# Patient Record
Sex: Female | Born: 1955
Health system: Southern US, Community
[De-identification: ages and names within clinical notes are randomized; demographics above are authoritative.]

## PROBLEM LIST (undated history)

## (undated) DIAGNOSIS — H269 Unspecified cataract: Secondary | ICD-10-CM

## (undated) DIAGNOSIS — J45909 Unspecified asthma, uncomplicated: Secondary | ICD-10-CM

## (undated) DIAGNOSIS — I509 Heart failure, unspecified: Secondary | ICD-10-CM

## (undated) DIAGNOSIS — I10 Essential (primary) hypertension: Secondary | ICD-10-CM

## (undated) DIAGNOSIS — M1990 Unspecified osteoarthritis, unspecified site: Secondary | ICD-10-CM

## (undated) DIAGNOSIS — G4730 Sleep apnea, unspecified: Secondary | ICD-10-CM

## (undated) DIAGNOSIS — R7303 Prediabetes: Secondary | ICD-10-CM

## (undated) DIAGNOSIS — R635 Abnormal weight gain: Secondary | ICD-10-CM

## (undated) DIAGNOSIS — Z23 Encounter for immunization: Secondary | ICD-10-CM

## (undated) DIAGNOSIS — Z6841 Body Mass Index (BMI) 40.0 and over, adult: Secondary | ICD-10-CM

## (undated) DIAGNOSIS — K219 Gastro-esophageal reflux disease without esophagitis: Secondary | ICD-10-CM

## (undated) DIAGNOSIS — H409 Unspecified glaucoma: Secondary | ICD-10-CM

## (undated) HISTORY — DX: Morbid (severe) obesity due to excess calories: E66.01

## (undated) HISTORY — DX: Prediabetes: R73.03

## (undated) HISTORY — DX: Abnormal weight gain: R63.5

## (undated) HISTORY — DX: Sleep apnea, unspecified: G47.30

## (undated) HISTORY — DX: Gastro-esophageal reflux disease without esophagitis: K21.9

## (undated) HISTORY — DX: Essential (primary) hypertension: I10

## (undated) HISTORY — DX: Encounter for immunization: Z23

## (undated) HISTORY — DX: Heart failure, unspecified: I50.9

## (undated) HISTORY — DX: Body Mass Index (BMI) 40.0 and over, adult: Z684

## (undated) HISTORY — DX: Unspecified glaucoma: H40.9

## (undated) HISTORY — DX: Unspecified asthma, uncomplicated: J45.909

## (undated) HISTORY — DX: Unspecified cataract: H26.9

## (undated) HISTORY — DX: Unspecified osteoarthritis, unspecified site: M19.90

---

## 2010-07-24 DIAGNOSIS — K635 Polyp of colon: Secondary | ICD-10-CM

## 2010-07-24 HISTORY — PX: KNEE SURGERY: SHX244

## 2010-07-24 HISTORY — DX: Polyp of colon: K63.5

## 2014-07-24 DIAGNOSIS — I2699 Other pulmonary embolism without acute cor pulmonale: Secondary | ICD-10-CM

## 2014-07-24 HISTORY — DX: Other pulmonary embolism without acute cor pulmonale: I26.99

## 2017-06-08 IMAGING — CT CT Brain W-O Contrast
2 of 4 series · 11 of 40 positions shown, 13 images · non-contrast
Comparison: none

CT Brain W-O Contrast
INDICATION: Neurologic Changes.  Low blood sugar                                         
 Pertinent History:                                                                        
 Surgical History:                                                                         
 Cancer: None                                                                              
 Comments: None.
TECHNIQUE: Helical acquisition with coronal reformats.                                    
 Utilized dose reduction techniques include: Vendor specific iterative                     
 reconstruction technique                                                                  
 Comparison is made with the old study from 07/06/2016.                                    
 The noncontrast CT images through the brain demonstrate no midline shift, mass            
 effect or acute intracranial hemorrhage. Only slight cerebral atrophy. No acute           
 cortical infarction or mass is identified. Included paranasal sinuses and                 
 mastoid air cells are adequately aerated. Cerebellar tonsils remain borderline            
 low-lying, similar to previous.

[Series 5: coronal · coronal · 0.49mm/px · 3 of 69 slices shown]
[im 23/69  brain]
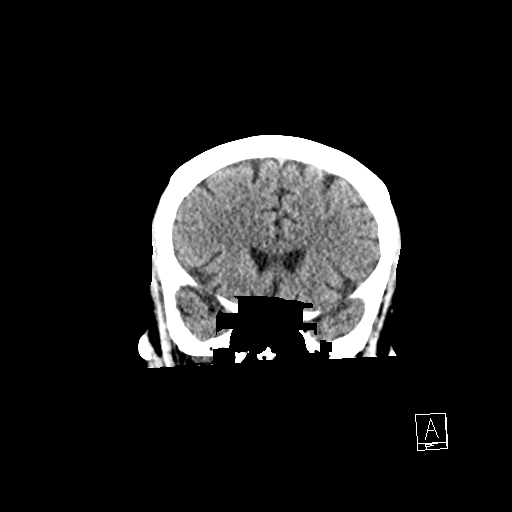
[im 31/69  brain]
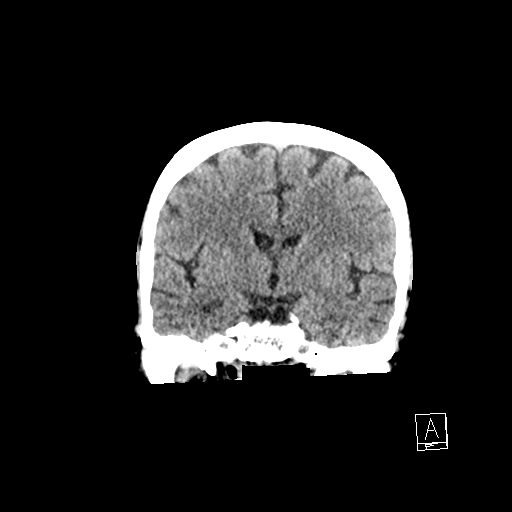
[im 38/69  brain]
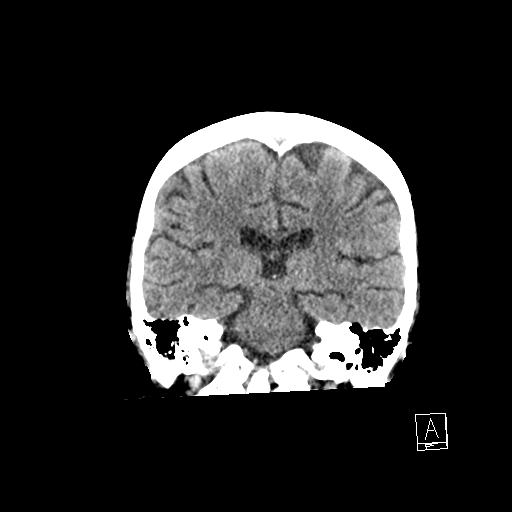

[Series 6: axial · axial · 0.49mm/px · z∈[+976,+1089]mm · 8 of 50 slices shown, 10 images]
[im 6/50  brain]
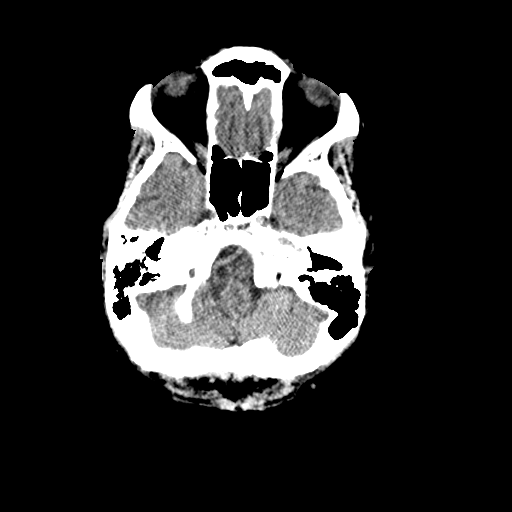
[im 6/50  bone]
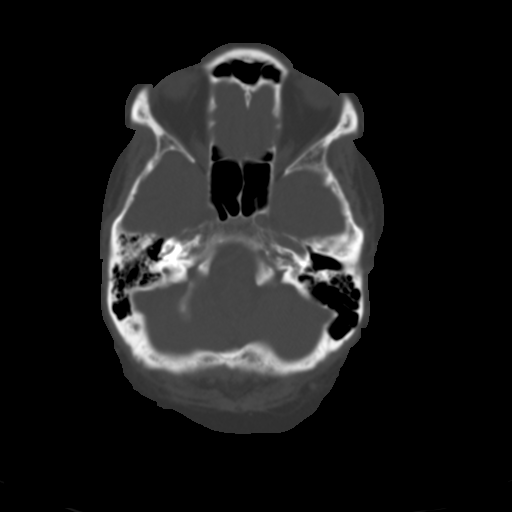
[im 11/50  brain]
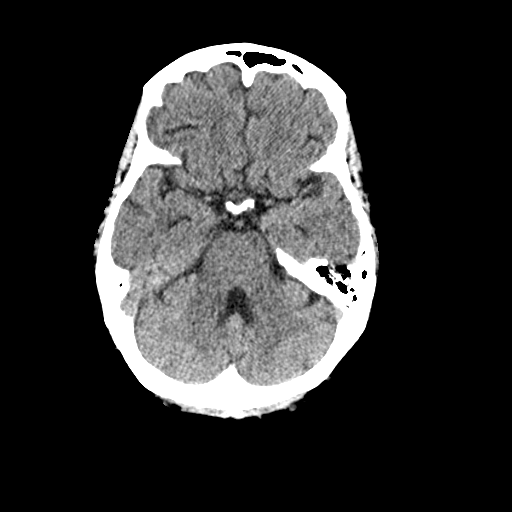
[im 17/50  brain]
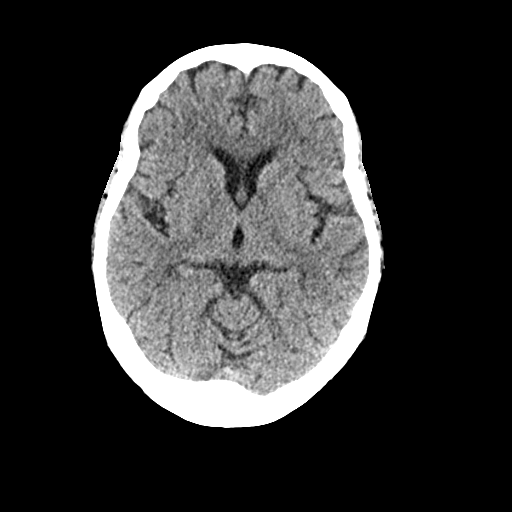
[im 22/50  brain]
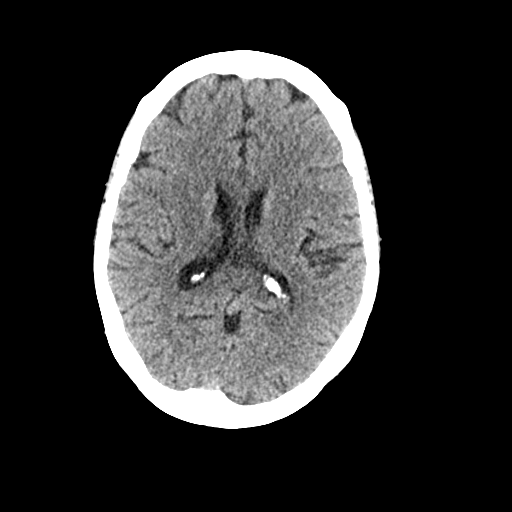
[im 28/50  brain]
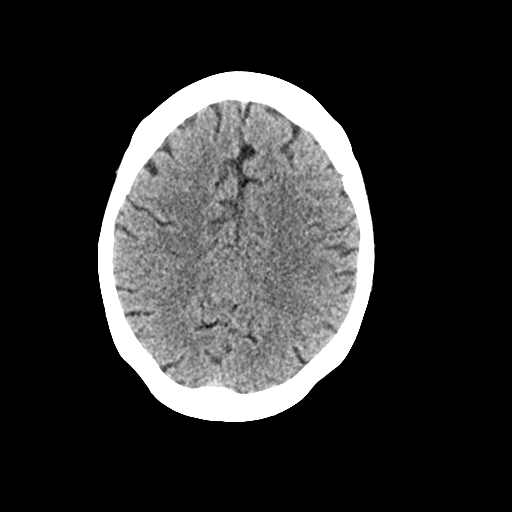
[im 28/50  bone]
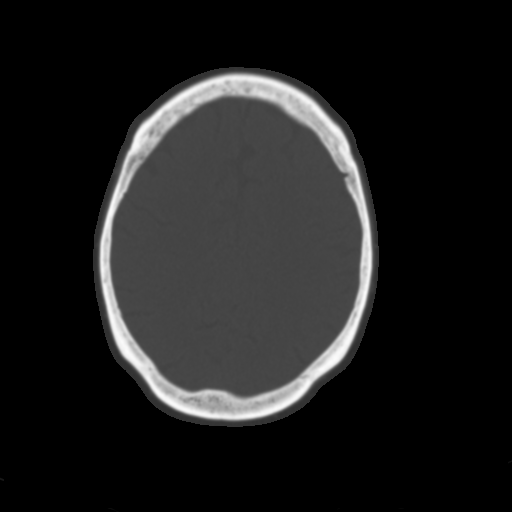
[im 33/50  brain]
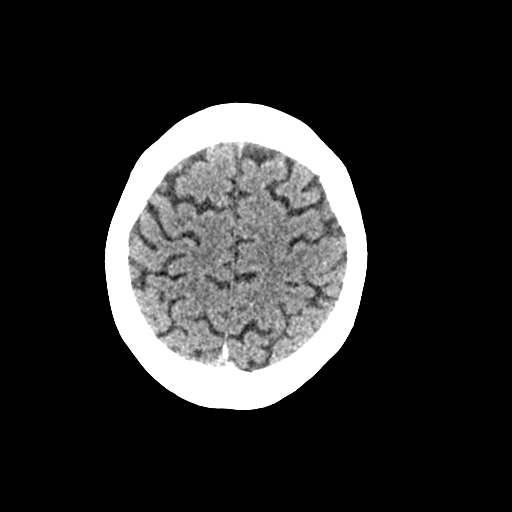
[im 39/50  brain]
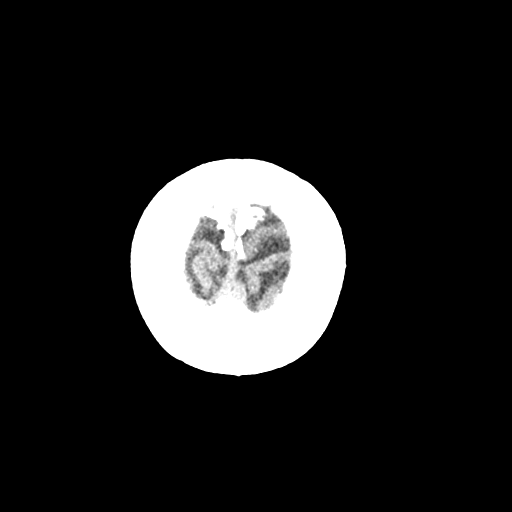
[im 44/50  brain]
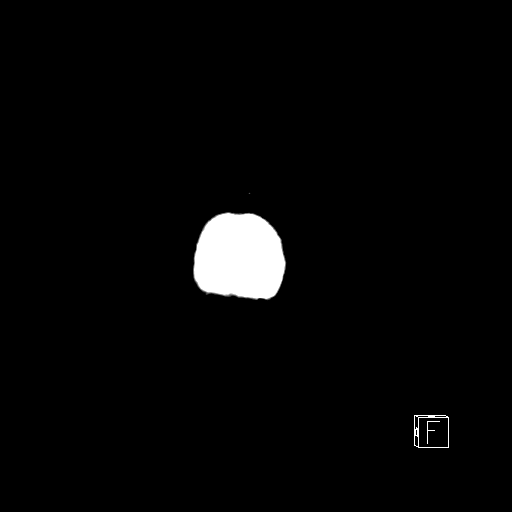

[11 of 40 positions shown; findings below may reference images not displayed]

IMPRESSION: No acute intracranial abnormality.

## 2019-02-13 DIAGNOSIS — I10 Essential (primary) hypertension: Secondary | ICD-10-CM | POA: Insufficient documentation

## 2019-02-13 DIAGNOSIS — I509 Heart failure, unspecified: Secondary | ICD-10-CM | POA: Insufficient documentation

## 2019-02-13 DIAGNOSIS — M069 Rheumatoid arthritis, unspecified: Secondary | ICD-10-CM | POA: Insufficient documentation

## 2019-02-13 DIAGNOSIS — E119 Type 2 diabetes mellitus without complications: Secondary | ICD-10-CM | POA: Insufficient documentation

## 2019-02-13 DIAGNOSIS — M1990 Unspecified osteoarthritis, unspecified site: Secondary | ICD-10-CM | POA: Insufficient documentation

## 2019-02-19 DIAGNOSIS — Z86711 Personal history of pulmonary embolism: Secondary | ICD-10-CM | POA: Insufficient documentation

## 2019-02-19 DIAGNOSIS — E119 Type 2 diabetes mellitus without complications: Secondary | ICD-10-CM | POA: Insufficient documentation

## 2019-02-19 DIAGNOSIS — R600 Localized edema: Secondary | ICD-10-CM | POA: Insufficient documentation

## 2019-02-19 DIAGNOSIS — G4733 Obstructive sleep apnea (adult) (pediatric): Secondary | ICD-10-CM | POA: Insufficient documentation

## 2019-02-19 DIAGNOSIS — R0683 Snoring: Secondary | ICD-10-CM | POA: Insufficient documentation

## 2019-02-19 DIAGNOSIS — M1710 Unilateral primary osteoarthritis, unspecified knee: Secondary | ICD-10-CM | POA: Insufficient documentation

## 2019-02-20 DIAGNOSIS — Z713 Dietary counseling and surveillance: Secondary | ICD-10-CM | POA: Insufficient documentation

## 2019-03-19 DIAGNOSIS — Z8669 Personal history of other diseases of the nervous system and sense organs: Secondary | ICD-10-CM

## 2019-03-19 HISTORY — DX: Personal history of other diseases of the nervous system and sense organs: Z86.69

## 2019-04-01 ENCOUNTER — Other Ambulatory Visit: Payer: Self-pay | Admitting: Physician Assistant

## 2019-08-29 ENCOUNTER — Encounter (INDEPENDENT_AMBULATORY_CARE_PROVIDER_SITE_OTHER): Payer: Self-pay | Admitting: Family Medicine

## 2019-09-04 ENCOUNTER — Encounter (INDEPENDENT_AMBULATORY_CARE_PROVIDER_SITE_OTHER): Payer: Self-pay | Admitting: Family Medicine

## 2019-09-18 ENCOUNTER — Encounter (INDEPENDENT_AMBULATORY_CARE_PROVIDER_SITE_OTHER): Payer: Self-pay | Admitting: Family Medicine

## 2019-09-21 ENCOUNTER — Encounter (INDEPENDENT_AMBULATORY_CARE_PROVIDER_SITE_OTHER): Payer: Self-pay

## 2019-09-22 ENCOUNTER — Telehealth (INDEPENDENT_AMBULATORY_CARE_PROVIDER_SITE_OTHER): Payer: No Typology Code available for payment source | Admitting: Family Medicine

## 2019-09-22 ENCOUNTER — Encounter (HOSPITAL_BASED_OUTPATIENT_CLINIC_OR_DEPARTMENT_OTHER): Payer: Self-pay

## 2019-09-22 ENCOUNTER — Encounter (HOSPITAL_BASED_OUTPATIENT_CLINIC_OR_DEPARTMENT_OTHER): Payer: Self-pay | Admitting: Family Medicine

## 2019-09-22 DIAGNOSIS — K219 Gastro-esophageal reflux disease without esophagitis: Secondary | ICD-10-CM

## 2019-09-22 DIAGNOSIS — R7303 Prediabetes: Secondary | ICD-10-CM

## 2019-09-22 DIAGNOSIS — I10 Essential (primary) hypertension: Secondary | ICD-10-CM

## 2019-09-22 DIAGNOSIS — I509 Heart failure, unspecified: Secondary | ICD-10-CM

## 2019-09-22 NOTE — Progress Notes (Signed)
Verbal consent has been obtained from the patient to conduct a video & telephone visit to minimize exposure to COVID-19 - YES    PCP: Caitlin Jacks, MD    ASSESSMENT AND PLAN:  Caitlin Malone was seen today for initial consult.    Diagnoses and all orders for this visit:    Morbid obesity    Gastroesophageal reflux disease, unspecified whether esophagitis present    Hypertension, unspecified type    Prediabetes    Congestive heart failure, unspecified HF chronicity, unspecified heart failure type      Chronic Illness Exacerbated 2/2 Wt gain.    Causes for weight gain and barriers to success noted in the HPI section discussed including food intake/habits and activity,  sleep patterns, med and labs review . Assessed weight related medical conditions noted above and add'l labs ordered if applicable.     PLAN:  Resolving Obesity/Overweight Recommendations:  Body mass index is 39.54 kg/m. Weight: 245 lb  Weight in (lb) to have BMI = 25: 154.6  1. She is interested in joining the [facility] and has began the enrollment process.  I agree, as this would be the best of the medical weight loss options to assist with weight loss.   The primary recommendation however is Weight Loss surgery, given her co morbidities and current BMI   Cautions/Concerns: on lasix 2/2 CHF, would need to be d/c'd when she starts the [facility], monitor kidney fxn.  Would need cardiac clearance as well prior to starting [facility].       CHF- stable . Follows with cardiology.     GERD, predm - weight related chronic medical condition(s) : Progressive with weight gain.   Should  improve with adequate weight loss (>5% BW) and implementation of current mgmt plan. Monitor.    Follow up with Caitlin Malone, Bariatric NP in 2 months.     Recommended weight loss options discussed:       [facility] ([facility]) for those with a BMI of >25. A comprehensive program that includes a Web designer, dieticians, exercise physiologists, and a behavioral  therapist.  The program entails placement on a very low calorie diet (VLCD)   (<800 Cal/day)  or low calorie diet (LCD) (<1000, <1200 Cal/day) along with a structured and graded exercise regimen and several informational sessions.   Typical weight loss is about 1-10 pounds/ week. AE includes fatigue, dizziness, headache or nausea, constipation, halitosis and muscle cramps in the first few days of the diet.   Medical clearance required by PCP, along with CBC w/o diff, CMP, TSH,  Lipid panel , Vitamin D,  Hgba1c (if applicable)  and EKG.       Weight loss medications/ Anti Obesity Medications (WLM/ AOM) such as Orlistat, Phentermine, Qsymia, Contrave, Liraglutide (Victoza/Saxenda), or Metformin were discussed. The indications, risks and benefits, and efficacy of each  were discussed in detail. Great caution must be taken as these weight loss medications are high risk/ controlled substances. Questions posed discussed.  The typical weight loss ranges from 5%-15% EBW with the use of the medications.        VLCD or LCD , along with an structured physical activity regimen ( exercise prescription)  with frequent office visits with our Bariatric Medicine team 1 on 1 .       Weight Loss Surgery (WLS)-   Gastric Band, Gastric sleeve,  gastric bypass   (BMI >35 with co morbidities, or BMI >40), intragastric balloon  (BMI 30-40), or revision (if  applicable).     Total time spent: 60-74  minutes was spent preparing for this visit (reviewing pt's chart, history, tests ),  interpreting results and communicating with pt, documentation in med record,  answering pt's questions, counseling & discussing behavioral modification for successful weight loss,  and coordination of care with respect to the above mentioned diagnoses and treatment plan and recommendations of the best weight loss option for the patient.            Subjective  Chief Complaint   Patient presents with   . Initial Consult     non sx wl    - Seen for  Initial Medical  Weight Loss Evaluation.   I had the pleasure of seeing Caitlin Malone,  Body mass index is 39.54 kg/m. Weight: 245 lb who has been struggling with weight management for a few years.      Weight gain hx    Personal goal weight(lb):  220 lb    (last: 15 yrs ago)  Has been losing 1 lb /week in past 7 weeks   Highest adult weight (lb): 246 (with [facility])   Lowest adult weight (lb):  (Weight in (lb) to have BMI = 25: 154.6)  RX tried: none       Associated factors of weight gain:    PA:  Walks 3x week, lifting weights as well.    Excess carbs/ sweets: yes  Emotional/comfort/ boredom eating: yes  Grazing/snacking: yes  Skip meals:  Night time eating:   Denies all except noted    ROS:    Negative for cp, palp.   Pos for ankle swelling (stable now on lasix 2/2 CHF)   As stated in HPI, all other systems reviewed and are otherwise negative.       Chf:  Stable on rx (lasix) , 4 weeks ago seen by cards. Stable (saukla)  ekg 1x 3 mos, stable ,   htn - stable   gerd - stable  Asthma - stable,, only uses inhaler prn   On neuontin--- started in dec for arthritis.     Past Surgical History:   Procedure Laterality Date   . KNEE SURGERY Right 2012     There is no problem list on file for this patient.    Past Medical History:   Diagnosis Date   . Asthma    . Congestive heart failure    . Gastroesophageal reflux disease    . Hypertension    . Prediabetes      Past Surgical History:   Procedure Laterality Date   . KNEE SURGERY Right 2012       Current Outpatient Medications:   .  Eliquis 5 MG, Take 5 mg by mouth daily, Disp: , Rfl:   .  furosemide (LASIX) 20 MG tablet, Take 20 mg by mouth daily, Disp: , Rfl:   .  gabapentin (NEURONTIN) 300 MG capsule, TAKE 1 CAPSULE BY MOUTH EVERY MORNING AND 2 CAPSULES BY MOUTH AT BEDTIME, Disp: , Rfl:   .  hydroxychloroquine (PLAQUENIL) 200 MG tablet, Take 200 mg by mouth 2 (two) times daily, Disp: , Rfl:   .  Loratadine (CLARITIN PO), Take by mouth daily, Disp: , Rfl:   .   olmesartan-hydrochlorothiazide (BENICAR HCT) 40-25 MG per tablet, Take 1 tablet by mouth daily, Disp: , Rfl:   .  potassium chloride 20 MEQ Tab CR, TK 1 T PO ONCE D, Disp: , Rfl:   No Known Allergies  Social History     Socioeconomic History   . Marital status: Married     Spouse name: Not on file   . Number of children: Not on file   . Years of education: Not on file   . Highest education level: Not on file   Occupational History   . Not on file   Social Needs   . Financial resource strain: Not on file   . Food insecurity     Worry: Not on file     Inability: Not on file   . Transportation needs     Medical: Not on file     Non-medical: Not on file   Tobacco Use   . Smoking status: Never Smoker   . Smokeless tobacco: Never Used   Substance and Sexual Activity   . Alcohol use: Not Currently   . Drug use: Never   . Sexual activity: Not on file   Lifestyle   . Physical activity     Days per week: Not on file     Minutes per session: Not on file   . Stress: Not on file   Relationships   . Social Wellsite geologist on phone: Not on file     Gets together: Not on file     Attends religious service: Not on file     Active member of club or organization: Not on file     Attends meetings of clubs or organizations: Not on file     Relationship status: Not on file   . Intimate partner violence     Fear of current or ex partner: Not on file     Emotionally abused: Not on file     Physically abused: Not on file     Forced sexual activity: Not on file   Other Topics Concern   . Dietary supplements / vitamins No   . Anesthesia problems No   . Blood thinners No   . Pregnant Not Asked   . Future Children Not Asked   . Number of Pregnancies? Not Asked   . Number of children Not Asked   . Miscarriages / Abortions? Not Asked   . Eats large amounts No   . Excessive Sweets No   . Skips meals No   . Eats excessive starches No   . Snacks or grazes Yes   . Emotional eater Yes   . Eats fried food No   . Eats fast food No   . Diet Center  No   . Doylene Bode No   . [place] [facility] No   . Nutri-System No   . Opti-Fast / Medi-Fast No   . Overeaters Anonymous No   . [facility] Yes   . TOPS No   . Weight Watchers No   . Atkins No   . Binging / Purging No   . Calorie Counting No   . Fasting No   . High Protein Yes   . Low Carb Yes   . Low Fat Yes   . [facility] Diet No   . Slim Fast No   . [place] No   . Stationary cycle or treadmill No   . Gym/fitness Classes No   . Home exercise/video No   . Swimming No   . Weight training No   . Walking or running Yes   . Hospitalization No   . Hypnosis No   . Physical therapy No   .  Psychological therapy No   . Residential program No   . Acutrim No   . [place]etta No   . Contrave No   . Dexatrim No   . Diethylpropion No   . Fastin No   . Fen - Phen No   . Ionamin / Adipex No   . Phentermine No   . Qsymia No   . Prozac No   . Saxenda No   . Topamax No   . Wellbutrin No   . Xenical (Orlistat, Alli) No   . Other Med No   . No impairment No   . Walks with cane/crutch No   . Requires a wheelchair No   . Bedridden No   . Are you currently being treated for depression? No   . Do you snore? No   . Are you receiving any medical or psychological services? No   . Do you ever wake up at night gasping for breath? No   . Do you have or have you been treated for an eating disorder? No   . Anyone ever told you that you stop breathing while asleep? No   . Do you exercise regularly? Yes   . Have you or family member ever have trouble with anesthesia? No   Social History Narrative   . Not on file       History reviewed. No pertinent family history.  Patient Care Team:  Caitlin Jacks, MD as PCP - General (Cardiology)    Family, Social history , Problem list and Allergies and meds reviewed.    Objective   Physical Exam     Constitutional: Body mass index is 39.54 kg/m., Well-developed, well-nourished. No distress.    Pulmonary/Chest: Effort normal  Neuro:  A&O x 3.   Psych:  Appropiate mood & affect. Behavior,  judgment & thought content normal.     Labs, studies reviewed:  '  Chemistry    No results found for: NA, K, CL, CO2, BUN, CREAT, GLU No results found for: CA, ALKPHOS, AST, ALT, BILITOTAL       No results found for: ALT, AST, GGT, ALKPHOS, BILITOTAL  No results found for: CHOL  No results found for: HDL  No results found for: LDL  No results found for: TRIG  No results found for: HGBA1C  No results found for: GLU, MICROALB, LDL, CREAT

## 2019-09-23 ENCOUNTER — Encounter (INDEPENDENT_AMBULATORY_CARE_PROVIDER_SITE_OTHER): Payer: Self-pay | Admitting: Family Medicine

## 2019-09-30 ENCOUNTER — Encounter (INDEPENDENT_AMBULATORY_CARE_PROVIDER_SITE_OTHER): Payer: Self-pay

## 2019-10-10 ENCOUNTER — Other Ambulatory Visit (INDEPENDENT_AMBULATORY_CARE_PROVIDER_SITE_OTHER): Payer: Self-pay | Admitting: Family Medicine

## 2019-10-13 ENCOUNTER — Ambulatory Visit: Payer: No Typology Code available for payment source

## 2019-10-13 NOTE — Progress Notes (Signed)
Initial Exercise Eval     Start Results: Date: 10/13/19           Have you had a formal exercise program? Yes    Where do you want to exercise?: Home   Have you ever used weights? Yes     Have you ever done exercise classes? Yes  How many days do you exercise a week? +4 days per week                    Do you have home equipment? Yes List Equipment: Free Weights          Personal Goals       1. Strengthen Legs                          2. Tone                              3.Lose Weight

## 2019-10-14 ENCOUNTER — Encounter (INDEPENDENT_AMBULATORY_CARE_PROVIDER_SITE_OTHER): Payer: Self-pay | Admitting: Family Medicine

## 2019-10-14 ENCOUNTER — Ambulatory Visit (INDEPENDENT_AMBULATORY_CARE_PROVIDER_SITE_OTHER): Payer: No Typology Code available for payment source | Admitting: Family Medicine

## 2019-10-14 VITALS — BP 130/86 | HR 68 | Temp 98.3°F | Resp 17 | Ht 66.0 in | Wt 245.2 lb

## 2019-10-14 DIAGNOSIS — M25561 Pain in right knee: Secondary | ICD-10-CM

## 2019-10-14 DIAGNOSIS — Z8639 Personal history of other endocrine, nutritional and metabolic disease: Secondary | ICD-10-CM

## 2019-10-14 DIAGNOSIS — R2689 Other abnormalities of gait and mobility: Secondary | ICD-10-CM

## 2019-10-14 DIAGNOSIS — I10 Essential (primary) hypertension: Secondary | ICD-10-CM

## 2019-10-14 DIAGNOSIS — M1990 Unspecified osteoarthritis, unspecified site: Secondary | ICD-10-CM

## 2019-10-14 DIAGNOSIS — G8929 Other chronic pain: Secondary | ICD-10-CM

## 2019-10-14 DIAGNOSIS — I509 Heart failure, unspecified: Secondary | ICD-10-CM

## 2019-10-14 DIAGNOSIS — Z86711 Personal history of pulmonary embolism: Secondary | ICD-10-CM

## 2019-10-14 DIAGNOSIS — W19XXXA Unspecified fall, initial encounter: Secondary | ICD-10-CM

## 2019-10-14 DIAGNOSIS — M25562 Pain in left knee: Secondary | ICD-10-CM

## 2019-10-14 DIAGNOSIS — G5622 Lesion of ulnar nerve, left upper limb: Secondary | ICD-10-CM

## 2019-10-14 NOTE — Progress Notes (Signed)
[facility]                       Date of Exam: 10/14/2019 4:06 PM        Patient ID: Caitlin Malone is a 64 y.o. female.  Attending Physician: Tyrell Antonio, FNP           This note was dictated using Dragon Medical One software, by [employer], a voice recognition speech to text software, and may contain inadvertent recognition errors. There may be inherent errors or limitations not intended by the user. Not all errors are caught or corrected. If there are questions or concerns about the content of this note or the information contained within the body of this dictation they should be addressed directly with the author for clarification.    Subjective:   Chief Complaint:  Chief Complaint   Patient presents with   . Establish Care     Pt is not fasting   . Knee Pain     Pt c/o bilateral knees weakness x 3-6 mo.  Arthritis dx 7 years.   . Fall     Pt fell yesterday. Pt did not hit head. Fell onto R knee.    Marland Kitchen Hypertension     Pt concerns of low at home bp readings.       HPI     Patient is a 64 year old female who presents today to establish care and with complaints of bilateral knee pain 3-6 moths, and pt had a fall yesterday onto the right knee.Pt moved from [facility] July 29.  She has a lot of specialYdoctors but does not have a primary care provider in the area.  She did not bring any medical records with her    1. Chronic pain of both knees   bilateral knee pain 3-6 moths, and pt had a fall yesterday onto the right knee.  Pt states she had been getting osteoarthritis injection.  She was going to a pain clinic.  Patient requests referral to pain clinic  The last injection was in November.    2. Fall, initial encounter  The patient fell yesterday and hit her right knee on the curb.  She did not hit her head.  She did not lose consciousness.  She states she has some pain and tenderness in her knee but denies any instability or immobility.    4. History of  pulmonary embolus (PE)  Hx of pe- pt aug 2016- sees pulmonologist them three times a year   Patient is currently on Eliquis 5 mg daily  Patient denies any issues with medication.    5. History of vitamin D deficiency  Patient has a history of vitamin D deficiency.  She currently takes vitamin D.    6.Entrapment of left ulnar nerve  Patient states she does not have full use of her left hand.  She cannot make a fist or close her fingers all the way.  She has seen a neurologist and neurosurgeon.  She has had EMG studies and has been diagnosed with ulnar nerve entrapment.    8. Hypertension, unspecified type  Patient currently takes on l olmesartan- hydrochlorothiazide 40/25 Mg a day  Pt takes medication regularly.  Patient states she does take her blood pressure at home.  Patient states sometimes her blood pressure is very low like 88/54.  Other times it is high like 130s to 140s  Pt denies ankle edema, headache, chest  pain, or palpitations.     9. Morbid obesity  Patient has been seen by weight management and dietary specialist.  She is going to be going to a new dietary specialist next week.  Pt has lost around 30 pounds over the past couple months.  She states she was 276LBS and on her home scale is 239LBS.     10. Arthritis  Patient currently takes gabapentin for pain.  Pt states that at times her whole body is in pain  Pt started 6 years ago.   She states her symptoms are aggrivated by temperature and bariartric pressure    11. Balance disturbance:  Patient states her balance has been feeling off.  She has seen neurology and neurosurgery.  They are unable to determine the cause.  Patient was found to have History of Chiari malformation.  This was detected on MRI after a fall she had in August.   She states she just feels unsteady.  She denies any dizziness or troubles with her vision.     Pt started started taking the iron.   Pt is taking probiotic   Patient requests referral for GYN.  Had a mammogram last year.   Has not had a Pap for a few years.  Patient will make follow-up visit.    Patient request refill  Of potassium chloride.-Patient to get updated blood work      Problem List:  Patient Active Problem List   Diagnosis   . Type 2 diabetes mellitus without complication   . Obstructive sleep apnea syndrome   . Morbid obesity   . Hypertensive disorder   . Encounter for weight loss counseling   . Edema of lower extremity   . Diabetes mellitus   . Congestive heart failure   . Arthritis of knee   . Arthritis   . Snoring   . History of pulmonary embolism       Current Medications:  Current Outpatient Medications   Medication Sig Dispense Refill   . Eliquis 5 MG Take 5 mg by mouth daily     . Ferrous Sulfate (IRON PO) Take by mouth     . furosemide (LASIX) 20 MG tablet Take 20 mg by mouth daily     . gabapentin (NEURONTIN) 300 MG capsule TAKE 1 CAPSULE BY MOUTH EVERY MORNING AND 2 CAPSULES BY MOUTH AT BEDTIME     . Loratadine (CLARITIN PO) Take by mouth daily     . montelukast (SINGULAIR) 10 MG tablet montelukast 10 mg tablet   TK 1 T PO  HS     . olmesartan-hydrochlorothiazide (BENICAR HCT) 40-25 MG per tablet Take 1 tablet by mouth daily     . potassium chloride 20 MEQ Tab CR TK 1 T PO ONCE D     . VITAMIN D PO Take by mouth     . hydroxychloroquine (PLAQUENIL) 200 MG tablet Take 200 mg by mouth 2 (two) times daily       No current facility-administered medications for this visit.       Allergies:  No Known Allergies    Immunizations:  Immunization History   Administered Date(s) Administered   . COVID-19 mRNA Vaccine Preservative Free 0.3 mL (PFIZER) 10/11/2019   . Influenza quadrivalent (IM) 6 months & up PRESERVED 04/23/2018        Past Medical History:  Past Medical History:   Diagnosis Date   . Asthma    . Congestive heart failure    . Gastroesophageal reflux  disease    . History of Chiari malformation 03/19/2019    [facility] ER, dx after fall   . Hypertension    . Prediabetes        Past Surgical History:  Past  Surgical History:   Procedure Laterality Date   . KNEE SURGERY Right 2012       Family History:  Family History   Problem Relation Age of Onset   . Cancer Mother    . Diabetes Mother    . Arthritis Mother    . Obesity Mother    . Cancer Maternal Grandfather        Social History:  Social History     Socioeconomic History   . Marital status: Married     Spouse name: Not on file   . Number of children: Not on file   . Years of education: Not on file   . Highest education level: Not on file   Occupational History   . Not on file   Tobacco Use   . Smoking status: Never Smoker   . Smokeless tobacco: Never Used   Substance and Sexual Activity   . Alcohol use: Not Currently   . Drug use: Never   . Sexual activity: Not on file   Other Topics Concern   . Dietary supplements / vitamins No   . Anesthesia problems No   . Blood thinners No   . Pregnant Not Asked   . Future Children Not Asked   . Number of Pregnancies? Not Asked   . Number of children Not Asked   . Miscarriages / Abortions? Not Asked   . Eats large amounts No   . Excessive Sweets No   . Skips meals No   . Eats excessive starches No   . Snacks or grazes Yes   . Emotional eater Yes   . Eats fried food No   . Eats fast food No   . Diet Center No   . Doylene Bode No   . [place] Weight Loss No   . Nutri-System No   . Opti-Fast / Medi-Fast No   . Overeaters Anonymous No   . [facility] Yes   . TOPS No   . Weight Watchers No   . Atkins No   . Binging / Purging No   . Calorie Counting No   . Fasting No   . High Protein Yes   . Low Carb Yes   . Low Fat Yes   . [facility] Diet No   . Slim Fast No   . [place] No   . Stationary cycle or treadmill No   . Gym/fitness Classes No   . Home exercise/video No   . Swimming No   . Weight training No   . Walking or running Yes   . Hospitalization No   . Hypnosis No   . Physical therapy No   . Psychological therapy No   . Residential program No   . Acutrim No   . [place]etta No   . Contrave No   . Dexatrim No   .  Diethylpropion No   . Fastin No   . Fen - Phen No   . Ionamin / Adipex No   . Phentermine No   . Qsymia No   . Prozac No   . Saxenda No   . Topamax No   . Wellbutrin No   . Xenical (Orlistat, Alli) No   .  Other Med No   . No impairment No   . Walks with cane/crutch No   . Requires a wheelchair No   . Bedridden No   . Are you currently being treated for depression? No   . Do you snore? No   . Are you receiving any medical or psychological services? No   . Do you ever wake up at night gasping for breath? No   . Do you have or have you been treated for an eating disorder? No   . Anyone ever told you that you stop breathing while asleep? No   . Do you exercise regularly? Yes   . Have you or family member ever have trouble with anesthesia? No   Social History Narrative   . Not on file     Social Determinants of Health     Financial Resource Strain:    . Difficulty of Paying Living Expenses:    Food Insecurity:    . Worried About Programme researcher, broadcasting/film/video in the Last Year:    . Barista in the Last Year:    Transportation Needs:    . Freight forwarder (Medical):    Marland Kitchen Lack of Transportation (Non-Medical):    Physical Activity:    . Days of Exercise per Week:    . Minutes of Exercise per Session:    Stress:    . Feeling of Stress :    Social Connections:    . Frequency of Communication with Friends and Family:    . Frequency of Social Gatherings with Friends and Family:    . Attends Religious Services:    . Active Member of Clubs or Organizations:    . Attends Banker Meetings:    Marland Kitchen Marital Status:    Intimate Partner Violence:    . Fear of Current or Ex-Partner:    . Emotionally Abused:    Marland Kitchen Physically Abused:    . Sexually Abused:        The following sections were reviewed this encounter by the provider:        ROS:  Review of Systems   Review of systems as noted in the HPI.    Vitals:  BP 130/86   Pulse 68   Temp 98.3 F (36.8 C)   Resp 17   Ht 1.676 m (5\' 6" )   Wt 111.2 kg (245 lb 3.2 oz)    BMI 39.58 kg/m      Objective:     Physical Exam:   Constitutional:      No apparent distress . Pt is obese.  Cardiovascular:      Rate and Rhythm: Normal rate and regular rhythm. Normal S1 and S2, no murmur     Pulses: Normal pulses.   Pulmonary:      Effort: Pulmonary effort is normal.      Breath sounds: Normal breath sounds     Lungs: Clear to auscultation bilaterally without wheezing or rhonchi  Musculoskeletal:      Right knee: Swelling present. briusing and tenderness over knee. No deformities.    Right knee: Swelling present. Normal range of motion.      Left knee: Normal range of motion.   Extremities: Mild lower leg edema   Neurological:      Mental Status: She is alert and oriented to person, place, and time.      Cranial Nerves: Cranial nerves are intact.      Sensory: Sensation is intact.  Motor: Weakness and tremor present.      Coordination: Romberg sign positive. Coordination normal. Finger-Nose-Finger Test normal. Impaired rapid alternating movements.      Gait: Gait is intact.   Psychiatric:         Mood and Affect: Mood normal.         Behavior: Behavior normal.         Thought Content: Thought content normal.         Judgment: Judgment normal.        Assessment:     1. Chronic pain of both knees  - Ambulatory referral to Pain Clinic    2. Acute pain of right knee  - Ambulatory referral to Pain Clinic    3. Fall, initial encounter    4. History of pulmonary embolus (PE)    5. History of vitamin D deficiency  - Vitamin D,25 OH, Total    6. Osteoarthritis, unspecified osteoarthritis type, unspecified site  - Ambulatory referral to Pain Clinic    7. Entrapment of left ulnar nerve    8. Hypertension, unspecified type  - CBC and differential  - Comprehensive metabolic panel  - TSH    9. Morbid obesity  - Hemoglobin A1C    10. Arthritis  - Ambulatory referral to Pain Clinic    11. Balance problem    12. Congestive heart failure, unspecified HF chronicity, unspecified heart failure type      Plan:      Patient to follow-up with pain clinic for pain management.  Patient to follow-up with dietary specialist.  Patient to get blood work done.  Provider to notify patient of results.  Provider to refill prescription of potassium based on blood work results.  Patient to send over medical records.  Care and red flag sxs reviewed  Healthy lifestyle discussed with patient  Patient to follow-up in 3 months.  Pt will contact provider or f/u sooner if sxs persist or worsen       Tyrell Antonio, FNP

## 2019-10-15 ENCOUNTER — Ambulatory Visit: Payer: Self-pay | Attending: Family Medicine

## 2019-10-15 ENCOUNTER — Encounter (INDEPENDENT_AMBULATORY_CARE_PROVIDER_SITE_OTHER): Payer: Self-pay | Admitting: Family Medicine

## 2019-10-15 DIAGNOSIS — E669 Obesity, unspecified: Secondary | ICD-10-CM

## 2019-10-15 NOTE — Progress Notes (Signed)
**Note De-Identified  Obfuscation** Medical Weight Loss Initial Nutrition Appointment    Appointment over zoom due to covid 19      Caitlin Malone is a 64 y.o. female  Appointment Date: 10/15/2019      Height : 5'6"   Current Weight: 243.8   BMI: 39.3       Enrollment Form with client's history information received and reviewed: Yes   Client Commitment Form reviewed and signed: No   Body composition taken: No   Client instructed on "Introduction to [facility] VLCD booklet" including: Review of labs and positive effect of weight loss, Program phases and required attendance at weekly sessions, Intake of Meal replacements and one protein based food meal for first 4 weeks , Instructions of Food Exchange system and how to make choices, Review of Binder Contents, Instructions on drinking 64 ounces of fluids in addition to meal replacements and Discussed choices of products available   Provided client with Weekly Activities Record and Daily Food Diary (in Binder): Yes   Informed client of all potential side effects of diet and ketosis: Yes    Meal Replacement form completed and collected: Yes   Treatment consent form signed: Yes      Notes: Participant and daughter on call. To be on 800 cal pattern for at least the first 4 weeks. She plans on walking and using weights in addition to our class 1-2 times/wk. She will continue to check her blood glucose as directed by her doctor. She will continue her water intake 80-100 oz/day. States she has done a low calorie high protein plan before with success and she currently eats [phone] cals/day and feels full.  She uses ensure and vegetable drinks- discussed not using them while on program. The following email was sent post appt.     Hi Caitlin Malone,   Here is the summary from our appointment today.   1. Email your weight weekly every Tuesday Starting 10/21/19 to [email or web address]. Please ensure your full legal name is on the email as well as your weight.   2. You will receive a link to the weekly  lecture every Tuesday. The lecture is given at 1 PM. If you cannot attend, it is ok as we will email out the recorded version later in the day.   3. Use the sign up genius links provided to you in your initial email to sign up for your 12 weeks of exercise classes. Please only sign up for one class per week (unless you have been told otherwise) during the proper dates of your 12 week session.    4. If you drink coffee or tea- drink it plain and keep to 16 oz/day.  5. Consume 80-100 oz/day of water- you may flavor with lemon, lime or cucumber.  6. Please consume the products as discussed (8, 11, 2, 5-6) starting Wednesday 10/22/19. Remember that you will also be eating one exchange of non-starchy vegetable. Please weigh/measure the cooked foods. No fats, no oils.   7. Continue with your exercise routine as discussed with the exercise specialist as able- if feeling tired listen to your body and decrease time/exertion during the first week or two.  8. If the products do not arrive in time or your do not like one, please use the backup meal pattern as well as letting us know the delivery was late. Remember we cannot take back any products.   9. Attached are your lab slips if needed for week 4 and 8 blood draws if you choose to **Note De-Identified Caitlin Malone Obfuscation** stay on 4 products/day longer than the first 4 weeks.   10. Continue to check your blood glucose as directed by your doctor.       Let me know if you have any questions.     Questions answered.     Caitlin Malone, RD  10/15/2019  [facility]

## 2019-10-17 ENCOUNTER — Ambulatory Visit (INDEPENDENT_AMBULATORY_CARE_PROVIDER_SITE_OTHER): Payer: No Typology Code available for payment source | Admitting: Family Medicine

## 2019-10-18 LAB — COMPREHENSIVE METABOLIC PANEL
ALT: 8 U/L (ref 6–29)
AST (SGOT): 11 U/L (ref 10–35)
Albumin/Globulin Ratio: 1.3 (calc) (ref 1.0–2.5)
Albumin: 4.2 g/dL (ref 3.6–5.1)
Alkaline Phosphatase: 55 U/L (ref 37–153)
BUN / Creatinine Ratio: 20 (calc) (ref 6–22)
BUN: 25 mg/dL (ref 7–25)
Bilirubin, Total: 0.4 mg/dL (ref 0.2–1.2)
CO2: 36 mmol/L — ABNORMAL HIGH (ref 20–32)
Calcium: 10 mg/dL (ref 8.6–10.4)
Chloride: 97 mmol/L — ABNORMAL LOW (ref 98–110)
Creatinine: 1.27 mg/dL — ABNORMAL HIGH (ref 0.50–0.99)
EGFR African American: 52 mL/min/{1.73_m2} — ABNORMAL LOW (ref >=60)
Globulin: 3.2 g/dL (calc) (ref 1.9–3.7)
Glucose: 83 mg/dL (ref 65–139)
NON-AFRICAN AMERICA EGFR: 45 mL/min/{1.73_m2} — ABNORMAL LOW (ref >=60)
Potassium: 3.8 mmol/L (ref 3.5–5.3)
Protein, Total: 7.4 g/dL (ref 6.1–8.1)
Sodium: 141 mmol/L (ref 135–146)

## 2019-10-18 LAB — CBC AND DIFFERENTIAL
Baso(Absolute): 42 cells/uL (ref 0–200)
Basophils: 0.7 %
Eosinophils Absolute: 150 cells/uL (ref 15–500)
Eosinophils: 2.5 %
Hematocrit: 32 % — ABNORMAL LOW (ref 35.0–45.0)
Hemoglobin: 10.6 g/dL — ABNORMAL LOW (ref 11.7–15.5)
Lymphocytes Absolute: 2250 cells/uL (ref 850–3900)
Lymphocytes: 37.5 %
MCH: 32.3 pg (ref 27.0–33.0)
MCHC: 33.1 g/dL (ref 32.0–36.0)
MCV: 97.6 fL (ref 80.0–100.0)
MPV: 11.9 fL (ref 7.5–12.5)
Monocytes Absolute: 564 cells/uL (ref 200–950)
Monocytes: 9.4 %
Neutrophils Absolute: 2994 cells/uL (ref 1500–7800)
Neutrophils: 49.9 %
Platelets: 282 10*3/uL (ref 140–400)
RBC: 3.28 10*6/uL — ABNORMAL LOW (ref 3.80–5.10)
RDW: 12.9 % (ref 11.0–15.0)
WBC: 6 10*3/uL (ref 3.8–10.8)

## 2019-10-18 LAB — TSH: TSH: 2.44 mIU/L (ref 0.40–4.50)

## 2019-10-18 LAB — HEMOGLOBIN A1C: Hemoglobin A1C: 4.7 % of total Hgb (ref <5.7)

## 2019-10-18 LAB — VITAMIN D,25 OH,TOTAL: Vitamin D, 25 OH, Total: 52 ng/mL (ref 30–100)

## 2019-10-20 NOTE — Progress Notes (Signed)
Possibly Anemia of chronic disease  Egfr- 52-Mod 3a ckd  Pt scheduled for f/u on 4/2- will discuss lab results with pt.     Donnita,     Your complete blood count does show some anemia. However, I do not believe this is due to an iron deficiency.     However, your white blood cells (which are your infection fighting cells) are normal and your platelets (which assess your ability to clot appropriately) are normal.     Your CMP showed signs of kidney disease.  I believe you already see a nephrologist.  We can discuss these results further at your follow-up on April 2.    Your thyroid stimulating hormone is normal and indicates normal thyroid function     A1C- Your A1c is normal.  No signs of diabetes    Vit d-your vitamin D level was normal    Please let me know if you have any questions or concerns.     Thank you,     Felisa Bonier, FNP-C

## 2019-10-21 ENCOUNTER — Ambulatory Visit: Payer: Self-pay

## 2019-10-24 ENCOUNTER — Ambulatory Visit (INDEPENDENT_AMBULATORY_CARE_PROVIDER_SITE_OTHER): Payer: No Typology Code available for payment source | Admitting: Family Medicine

## 2019-10-24 ENCOUNTER — Encounter (INDEPENDENT_AMBULATORY_CARE_PROVIDER_SITE_OTHER): Payer: Self-pay | Admitting: Family Medicine

## 2019-10-24 ENCOUNTER — Other Ambulatory Visit (INDEPENDENT_AMBULATORY_CARE_PROVIDER_SITE_OTHER): Payer: Self-pay | Admitting: Family Medicine

## 2019-10-24 VITALS — BP 124/86 | HR 68 | Temp 97.7°F | Resp 17 | Ht 66.0 in | Wt 243.2 lb

## 2019-10-24 DIAGNOSIS — E1121 Type 2 diabetes mellitus with diabetic nephropathy: Secondary | ICD-10-CM

## 2019-10-24 DIAGNOSIS — L910 Hypertrophic scar: Secondary | ICD-10-CM

## 2019-10-24 DIAGNOSIS — M79642 Pain in left hand: Secondary | ICD-10-CM

## 2019-10-24 DIAGNOSIS — R7989 Other specified abnormal findings of blood chemistry: Secondary | ICD-10-CM

## 2019-10-24 DIAGNOSIS — M25561 Pain in right knee: Secondary | ICD-10-CM

## 2019-10-24 DIAGNOSIS — M25562 Pain in left knee: Secondary | ICD-10-CM

## 2019-10-24 DIAGNOSIS — R29898 Other symptoms and signs involving the musculoskeletal system: Secondary | ICD-10-CM

## 2019-10-24 DIAGNOSIS — N1831 Chronic kidney disease, stage 3a: Secondary | ICD-10-CM | POA: Insufficient documentation

## 2019-10-24 DIAGNOSIS — G8929 Other chronic pain: Secondary | ICD-10-CM

## 2019-10-24 DIAGNOSIS — E1122 Type 2 diabetes mellitus with diabetic chronic kidney disease: Secondary | ICD-10-CM | POA: Insufficient documentation

## 2019-10-24 DIAGNOSIS — Z124 Encounter for screening for malignant neoplasm of cervix: Secondary | ICD-10-CM

## 2019-10-24 NOTE — Progress Notes (Signed)
[facility] [place] [facility]                       Date of Exam: 10/24/2019 4:41 PM        Patient ID: Caitlin Malone is a 64 y.o. female.  Attending Physician: Tyrell Antonio, FNP           This note was dictated using Dragon Medical One software, by [employer], a voice recognition speech to text software, and may contain inadvertent recognition errors. There may be inherent errors or limitations not intended by the user. Not all errors are caught or corrected. If there are questions or concerns about the content of this note or the information contained within the body of this dictation they should be addressed directly with the author for clarification.    Subjective:   Chief Complaint:  Chief Complaint   Patient presents with   . Hand Pain     Pt c/o L hand pain x 11/2018. Pt c/o stiffness worsening.    . Follow-up     Lab results f/u        HPI     Pt is a 44 yof who presents today for follow-up on blood work.  The patient is present with her daughter as well.  History is provided by both of them.    1. Elevated serum creatinine  Patient's blood work from October 17, 2019 showed elevated creatinine 1.27 along with decreased EGFR 52 African-American  The patient is also seen rheumatology who did blood work a month before and her serum creatinine levels were 1.12 at that time.  The daughter and patient state that her creatinine levels were not elevated prior to these visits.    Pt is ona high protein diet currently.  She eats high amounts of protein and low amounts of carbs she is on a restricted 100-calorie diet momentarily she goes to a [facility] weight loss center for weight management.    2. Hand pain- left:  The patient has chronic pain and went to see a pain management specialist.  She was given a buprenorphine patch 5mg .  The patient states she has not tried the patch yet.  She is currently not taking her meloxicam and plaquenil and gabapentin 300 mg because she will be getting  an MRI on her hand next Friday and was told to discontinue her medications prior to getting her MRI.  She has a diagnosis of entrapment of left ulnar nerve  Patient states she does not have full use of her left hand.  She cannot make a fist or close her fingers all the way.    2. Bilateral leg weakness/ chronic knee pain  The patient has experienced instability and weakness in her legs for the past several years.  She has had a few falls.  She requests a referral to physical therapy to help with her strength.  She has also had chronic bilateral knee pain for the past 3 to 6 months.  She was getting injections from pain management.    3. Keloid  Patient states she burned her left hand using a heating pad a while ago.  She states it is healed but she has developed a keloid.  She requests a referral to dermatology to see if they can do anything about her keloid    4. Type 2 diabetes mellitus with stage 3a chronic kidney disease, without long-term current use of insulin  Patient  is not taking any medication for her diabetes.    5. Stage 3a chronic kidney disease  Blood work from October 17, 2019 showed decreased EGFR 52 African-American.  This is a new finding for the patient.    6. Screening for malignant neoplasm of cervix  Patient requests referral for Pap smear.    Problem List:  Patient Active Problem List   Diagnosis   . Type 2 diabetes mellitus without complication   . Obstructive sleep apnea syndrome   . Morbid obesity   . Hypertensive disorder   . Encounter for weight loss counseling   . Edema of lower extremity   . Diabetes mellitus   . Congestive heart failure   . Arthritis of knee   . Arthritis   . Snoring   . History of pulmonary embolism   . Type 2 diabetes mellitus with diabetic chronic kidney disease       Current Medications:  Current Outpatient Medications   Medication Sig Dispense Refill   . Eliquis 5 MG Take 5 mg by mouth daily     . Ferrous Sulfate (IRON PO) Take by mouth     . furosemide (LASIX) 20  MG tablet Take 20 mg by mouth daily     . gabapentin (NEURONTIN) 300 MG capsule TAKE 1 CAPSULE BY MOUTH EVERY MORNING AND 2 CAPSULES BY MOUTH AT BEDTIME     . hydroxychloroquine (PLAQUENIL) 200 MG tablet Take 200 mg by mouth 2 (two) times daily     . Loratadine (CLARITIN PO) Take by mouth daily     . montelukast (SINGULAIR) 10 MG tablet montelukast 10 mg tablet   TK 1 T PO  HS     . olmesartan-hydrochlorothiazide (BENICAR HCT) 40-25 MG per tablet Take 1 tablet by mouth daily     . VITAMIN D PO Take by mouth       No current facility-administered medications for this visit.       Allergies:  No Known Allergies    Immunizations:  Immunization History   Administered Date(s) Administered   . COVID-19 mRNA Vaccine Preservative Free 0.3 mL (PFIZER) 10/11/2019   . Influenza quadrivalent (IM) 6 months & up PRESERVED 04/23/2018        Past Medical History:  Past Medical History:   Diagnosis Date   . Asthma    . Congestive heart failure    . Gastroesophageal reflux disease    . History of Chiari malformation 03/19/2019    [facility], dx after fall   . Hypertension    . Prediabetes        Past Surgical History:  Past Surgical History:   Procedure Laterality Date   . KNEE SURGERY Right 2012       Family History:  Family History   Problem Relation Age of Onset   . Cancer Mother    . Diabetes Mother    . Arthritis Mother    . Obesity Mother    . Cancer Maternal Grandfather        Social History:  Social History     Socioeconomic History   . Marital status: Married     Spouse name: Not on file   . Number of children: Not on file   . Years of education: Not on file   . Highest education level: Not on file   Occupational History   . Not on file   Tobacco Use   . Smoking status: Never Smoker   . Smokeless  tobacco: Never Used   Substance and Sexual Activity   . Alcohol use: Not Currently   . Drug use: Never   . Sexual activity: Not on file   Other Topics Concern   . Dietary supplements / vitamins No   . Anesthesia problems No    . Blood thinners No   . Pregnant Not Asked   . Future Children Not Asked   . Number of Pregnancies? Not Asked   . Number of children Not Asked   . Miscarriages / Abortions? Not Asked   . Eats large amounts No   . Excessive Sweets No   . Skips meals No   . Eats excessive starches No   . Snacks or grazes Yes   . Emotional eater Yes   . Eats fried food No   . Eats fast food No   . Diet Center No   . Doylene Bode No   . [place] Weight Loss No   . Nutri-System No   . Opti-Fast / Medi-Fast No   . Overeaters Anonymous No   . [facility] Yes   . TOPS No   . Weight Watchers No   . Atkins No   . Binging / Purging No   . Calorie Counting No   . Fasting No   . High Protein Yes   . Low Carb Yes   . Low Fat Yes   . [facility] Diet No   . Slim Fast No   . [place] No   . Stationary cycle or treadmill No   . Gym/fitness Classes No   . Home exercise/video No   . Swimming No   . Weight training No   . Walking or running Yes   . Hospitalization No   . Hypnosis No   . Physical therapy No   . Psychological therapy No   . Residential program No   . Acutrim No   . Byetta No   . Contrave No   . Dexatrim No   . Diethylpropion No   . Fastin No   . Fen - Phen No   . Ionamin / Adipex No   . Phentermine No   . Qsymia No   . Prozac No   . Saxenda No   . Topamax No   . Wellbutrin No   . Xenical (Orlistat, Alli) No   . Other Med No   . No impairment No   . Walks with cane/crutch No   . Requires a wheelchair No   . Bedridden No   . Are you currently being treated for depression? No   . Do you snore? No   . Are you receiving any medical or psychological services? No   . Do you ever wake up at night gasping for breath? No   . Do you have or have you been treated for an eating disorder? No   . Anyone ever told you that you stop breathing while asleep? No   . Do you exercise regularly? Yes   . Have you or family member ever have trouble with anesthesia? No   Social History Narrative   . Not on file     Social Determinants of Health      Financial Resource Strain:    . Difficulty of Paying Living Expenses:    Food Insecurity:    . Worried About Programme researcher, broadcasting/film/video in the Last Year:    . Barista in  the Last Year:    Transportation Needs:    . Freight forwarder (Medical):    Marland Kitchen Lack of Transportation (Non-Medical):    Physical Activity:    . Days of Exercise per Week:    . Minutes of Exercise per Session:    Stress:    . Feeling of Stress :    Social Connections:    . Frequency of Communication with Friends and Family:    . Frequency of Social Gatherings with Friends and Family:    . Attends Religious Services:    . Active Member of Clubs or Organizations:    . Attends Banker Meetings:    Marland Kitchen Marital Status:    Intimate Partner Violence:    . Fear of Current or Ex-Partner:    . Emotionally Abused:    Marland Kitchen Physically Abused:    . Sexually Abused:        The following sections were reviewed this encounter by the provider:   Tobacco  Allergies  Meds  Problems  Med Hx  Surg Hx  Fam Hx         ROS:  Review of Systems   Review of systems as noted in the HPI.    Vitals:  BP 124/86   Pulse 68   Temp 97.7 F (36.5 C)   Resp 17   Ht 1.676 m (5\' 6" )   Wt 110.3 kg (243 lb 3.2 oz)   BMI 39.25 kg/m      Objective:     Physical Exam:  Physical Exam   Constitutional:      No apparent distress   Cardiovascular:      Rate and Rhythm: Normal rate and regular rhythm. Normal S1 and S2, no murmur     Pulses: Normal pulses.   Pulmonary:      Effort: Pulmonary effort is normal.      Breath sounds: Normal breath sounds     Lungs: Clear to auscultation bilaterally without wheezing or rhonchi  Musculoskeletal:      Right/ Leeft knee: Swelling present. No deformities.  Normal ROM. Weakness in bilateral knees present.   Extremities Mild lower leg edema   Neurological:      Mental Status: She is alert and oriented to person, place, and time.      Cranial Nerves: Cranial nerves are intact.      Sensory: Sensation is intact.      Motor: Weakness  and tremor present left hand  Skin: Keloid and hyperpigmentation over left forearm.   Psychiatric:         Mood and Affect: Mood normal.         Behavior: Behavior normal.         Thought Content: Thought content normal.         Judgment: Judgment normal.        Assessment:     1. Elevated serum creatinine  - Urine Microalbumin Random  - Comprehensive metabolic panel  - Ambulatory referral to Nephrology    2. Bilateral leg weakness  - Referral to Physical Therapy - EXTERNAL    3. Keloid  - Ambulatory referral to Dermatology    4. Type 2 diabetes mellitus with stage 3a chronic kidney disease, without long-term current use of insulin  - Urine Microalbumin Random  - Comprehensive metabolic panel  - Ambulatory referral to Nephrology    5. Stage 3a chronic kidney disease  - Ambulatory referral to Nephrology    6. Screening for  malignant neoplasm of cervix  - Ambulatory referral to Obstetrics / Gynecology    7. Pain of left hand    8. Chronic pain of both knees  - Referral to Physical Therapy - EXTERNAL      Plan:     1. Elevated serum creatinine/5. Stage 3a chronic kidney disease  - Urine Microalbumin Random to be done in office  - Comprehensive metabolic panel order provided for future blood work  - Ambulatory referral to Nephrology provided depending on microalbumin and future CMP    2. Bilateral leg weakness/8. Chronic pain of both knees  - Referral to Physical Therapy provided  Patient to follow-up with physical therapist.    3. Keloid  - Ambulatory referral to Dermatology provided    4. Type 2 diabetes mellitus with stage 3a chronic kidney disease, without long-term current use of insulin  Patient follows signs and symptoms persist or worsen    6. Screening for malignant neoplasm of cervix  - Ambulatory referral to Obstetrics / Gynecology provided  Patient to follow-up with GYN    7. Pain of left hand  Patient to get MRI next week.    Care and red flag sxs reviewed  Healthy lifestyle discussed with patient  Pt will  contact provider or f/u sooner if sxs persist or worsen       Tyrell Antonio, FNP

## 2019-10-25 LAB — MICROALBUMIN, RANDOM URINE
Microalbumin/Creatinine Ratio: 7 mcg/mg creat (ref <30)
Microalbumin: 0.6 mg/dL
Urine Creatinine, Random: 92 mg/dL (ref 20–275)

## 2019-10-25 NOTE — Progress Notes (Signed)
Calene,     Your urine was normal. So that's good!    I say we check your kidney function again in 3 weeks. If you can stay off your meloxicam and gabapentin at least until we recheck your levels, that would be good. Please use the orders I provided you yesterday and get them done in three weeks.     Let me know if you have any questions.    Thank you,     Felisa Bonier, FNP-C

## 2019-10-27 ENCOUNTER — Other Ambulatory Visit (INDEPENDENT_AMBULATORY_CARE_PROVIDER_SITE_OTHER): Payer: Self-pay | Admitting: Family Medicine

## 2019-10-27 ENCOUNTER — Telehealth (INDEPENDENT_AMBULATORY_CARE_PROVIDER_SITE_OTHER): Payer: Self-pay | Admitting: Family Medicine

## 2019-10-27 DIAGNOSIS — E1121 Type 2 diabetes mellitus with diabetic nephropathy: Secondary | ICD-10-CM

## 2019-10-27 DIAGNOSIS — N1831 Chronic kidney disease, stage 3a: Secondary | ICD-10-CM

## 2019-10-27 DIAGNOSIS — R7989 Other specified abnormal findings of blood chemistry: Secondary | ICD-10-CM

## 2019-10-27 NOTE — Progress Notes (Signed)
Pt request referral to nephrology. Pt has appt   DR Roswell Nickel   [address] [place] [address]   PH; [phone]  FAX [phone]    APPT IS 10/30/19 @10AM 

## 2019-10-27 NOTE — Telephone Encounter (Signed)
Called pt left detailed message RE Ref info from [facility]

## 2019-10-27 NOTE — Telephone Encounter (Signed)
PT NEEDS AN ORDER TO SEE THE SPECIALIST     Dx CODES:  R79.89   E11.21   N18.31    DR Roswell Nickel   [address] [place] [address]   PH; [phone]  FAX [phone]    APPT IS 10/30/19 @10AM 

## 2019-10-27 NOTE — Telephone Encounter (Signed)
Please call pt and advise new referral order was sent to pt mychart

## 2019-10-28 ENCOUNTER — Ambulatory Visit: Payer: Self-pay

## 2019-10-30 ENCOUNTER — Telehealth: Payer: Self-pay

## 2019-10-30 NOTE — Telephone Encounter (Signed)
**Note De-Identified  Obfuscation** Phone call with pt and daughter. States she "met with my nephrologist today. She is not sure I have Chronic Kidney Disease and needs to investigate further. There was no protein in my urine but my creatinine was high. The nephrologist still wants me to cut back on the protein and suggested 60-80 grams of protein."     Therefore we will change her pattern to one product/day and increase her calories to closer to 1200/day with the addition of carbohydrates. See attached pattern. She will also keep her fluids to 40-50 oz/day per her physician. She is to f/u with the nephrologist x 4 weeks.     Questions answered.

## 2019-10-31 ENCOUNTER — Telehealth (INDEPENDENT_AMBULATORY_CARE_PROVIDER_SITE_OTHER): Payer: Self-pay | Admitting: Family Medicine

## 2019-10-31 NOTE — Telephone Encounter (Signed)
Patient left message stating she fell and was in the ER yesterday and needs Ortho names.   I called and spoke with patient and daughter Caitlin Malone.  Patient was sitting, got up to a standing position bent over to pick up her purse and her knees wouldn't hold her up and she fell and broke her left ankle yesterday. She went to [facility] Emergency Room, she is in a soft cast and on crutches.   She is unable to use the crutches because she doesn't have the upper body strength, they are requesting a wheelchair.  She hasn't been able to get in with the Orthopedist they have referred her to. I gave her names and numbers from our list.   She states her potassium was low, was given potassium, they want her to follow up with her PCP.   She states her creatinine was 1.7 and they want her to follow up with Nephrologist.     Mom's cell [phone], can leave detailed message if no answer  Daughter [phone], given verbal permission by patient to speak with daughter and/or leave message if needed

## 2019-11-03 ENCOUNTER — Encounter (INDEPENDENT_AMBULATORY_CARE_PROVIDER_SITE_OTHER): Payer: Self-pay | Admitting: Family Medicine

## 2019-11-03 NOTE — Telephone Encounter (Signed)
Please call pt and see how she is doing. As well, if she still needs any referrals from [place]. As well, if she was able to get into the orthopedic office    She was seen on April 2 and has already been given referrals to nephrology for her creatinine.

## 2019-11-03 NOTE — Telephone Encounter (Signed)
Spoke with daughter, Joanne Gavel, they are currently at [facility] in [place] now. They ordering a Ct Scan, they think there may be a fracture. They are in the process of fitting her for a boot. I asked if Ortho would be able to write order for the wheelchair since there were there now and the daughter stated they want her to be as mobile as possible and recommended her to get a rolling scooter (order off [employer])     The ER told them that she shouldn't be off of the potassium while she is taking Furosemide and Losartan.   Do you want them to schedule a follow up to discuss this with you ? In office or virtual visit ?    She had an appointment with Nephrology the Thursday before the accident, they havent followed up from the ER with them as of yet.

## 2019-11-04 ENCOUNTER — Ambulatory Visit: Payer: Self-pay

## 2019-11-05 ENCOUNTER — Ambulatory Visit: Payer: Self-pay

## 2019-11-05 NOTE — Progress Notes (Signed)
**Note De-Identified  Obfuscation** Missed the appt. Needs to reschedule.

## 2019-11-11 ENCOUNTER — Ambulatory Visit: Payer: Self-pay

## 2019-11-18 ENCOUNTER — Ambulatory Visit: Payer: Self-pay

## 2019-11-19 ENCOUNTER — Ambulatory Visit: Payer: Self-pay

## 2019-11-19 DIAGNOSIS — E669 Obesity, unspecified: Secondary | ICD-10-CM

## 2019-11-19 LAB — COMPREHENSIVE METABOLIC PANEL
ALT: 8 U/L (ref 6–29)
AST (SGOT): 10 U/L (ref 10–35)
Albumin/Globulin Ratio: 1.4 (calc) (ref 1.0–2.5)
Albumin: 4.1 g/dL (ref 3.6–5.1)
Alkaline Phosphatase: 63 U/L (ref 37–153)
BUN: 17 mg/dL (ref 7–25)
Bilirubin, Total: 0.4 mg/dL (ref 0.2–1.2)
CO2: 32 mmol/L (ref 20–32)
Calcium: 9.7 mg/dL (ref 8.6–10.4)
Chloride: 103 mmol/L (ref 98–110)
Creatinine: 0.86 mg/dL (ref 0.50–0.99)
EGFR African American: 84 mL/min/{1.73_m2} (ref >=60)
Globulin: 2.9 g/dL (calc) (ref 1.9–3.7)
Glucose: 84 mg/dL (ref 65–99)
NON-AFRICAN AMERICA EGFR: 72 mL/min/{1.73_m2} (ref >=60)
Potassium: 4 mmol/L (ref 3.5–5.3)
Protein, Total: 7 g/dL (ref 6.1–8.1)
Sodium: 143 mmol/L (ref 135–146)

## 2019-11-19 NOTE — Progress Notes (Signed)
Caitlin Malone,     Your CMP is normal. All of your numbers have greatly improved. This could be to due to discontinuing your meloxicam and gabapentin. No signs of electrolyte imbalances, kidney dysfunction, or liver dysfunction.     Please let me know if you have any questions or concerns. I hope you are doing well and feeling better.      Thank you,     Felisa Bonier, FNP-C

## 2019-11-19 NOTE — Progress Notes (Signed)
Medical Weight Loss Follow Up Appointment with RD    Caitlin Malone is a 64 y.o. female  Appointment Date: 11/19/2019      Height : 5'6"   Current Weight: 237 lbs. (self reported)  BMI: 38.2     Weight Loss: 6.8 lbs.       Client instructed on "Introduction to New Direction LCD booklet" including:Review of Progress for first few weeks, Getting started on the LCD Program, Intake of Meal replacements and food meal using Exchange lists, Review of food Exchange system and Exchange System activity (See Master), Instruction on drinking 64 ounces of fluids in addition to meal replacements and Discussed choices of products available     Provided client with Weekly Activities Record and Daily Food Diary (in Binder): Yes    Sample meal/menu provided using exchange system: Yes    Review of food exchange system for continued weight maintenance/loss: Yes    Handouts given: Recipe Makeover, Modifying Recipes Activity, RD Session 2 Homework, Focus on Fat and How Much Fat is That? Activity Culinary resources, Cracking the Freezer case      Notes: Verbal consent has been obtained to conduct appointment virtually due to COVID-19.    Daughter is on the Zoom call with patient.  Patient began program on 800 calorie meal plan but due to concerns about her kidneys as reported by her nephrologist, she was to keep protein intake to 60-80 gms/day and transitioned to the 1200 calorie meal plan with fluid restriction of 40-50 ounces/day. Patient reports  the 1200 calorie meal plan is not working for her to lose weight. She desires to keep her calorie level lower at about 1000 to promote further weight loss. Modified 1200 calorie meal plan discussed with patient to use two meal replacements/day to provide 54 gms protein and have the balance of her protein come from her food meals.   States she continues to take Vitamin D supplement   Patient recently sprained her ankle and has not been active but has been attending the program exercise  classes, doing chair exercises.   Instructed on using online food diary to track dietary intake and for self monitoring.  Next RD appointment made.  Questions answered.      Alben Spittle, RD  11/19/2019   Nutrition Educator  [facility]

## 2019-11-25 ENCOUNTER — Ambulatory Visit: Payer: Self-pay

## 2019-11-26 ENCOUNTER — Other Ambulatory Visit: Payer: Self-pay | Admitting: Nephrology

## 2019-11-27 ENCOUNTER — Encounter (INDEPENDENT_AMBULATORY_CARE_PROVIDER_SITE_OTHER): Payer: Self-pay | Admitting: Family Medicine

## 2019-11-28 ENCOUNTER — Other Ambulatory Visit: Payer: Self-pay | Admitting: Nephrology

## 2019-12-01 ENCOUNTER — Encounter (INDEPENDENT_AMBULATORY_CARE_PROVIDER_SITE_OTHER): Payer: Self-pay | Admitting: Family Medicine

## 2019-12-01 ENCOUNTER — Other Ambulatory Visit (INDEPENDENT_AMBULATORY_CARE_PROVIDER_SITE_OTHER): Payer: Self-pay | Admitting: Family Medicine

## 2019-12-01 NOTE — Telephone Encounter (Signed)
Patient called requesting refill on lasix.

## 2019-12-02 ENCOUNTER — Other Ambulatory Visit (INDEPENDENT_AMBULATORY_CARE_PROVIDER_SITE_OTHER): Payer: Self-pay | Admitting: Family Medicine

## 2019-12-02 ENCOUNTER — Encounter (INDEPENDENT_AMBULATORY_CARE_PROVIDER_SITE_OTHER): Payer: Self-pay | Admitting: Family Medicine

## 2019-12-02 ENCOUNTER — Ambulatory Visit: Payer: Self-pay

## 2019-12-02 NOTE — Telephone Encounter (Signed)
Thank you.  Rx sent.   Pt to fu with cardiology for further rx, monitoring, and management of CHF

## 2019-12-02 NOTE — Telephone Encounter (Signed)
I can send the pt a 30 day supply of meidicaiton.   However, this request is for 40mg  and it states the patient usually takes 20mg /daily. Please specify which dose pt is taking and why the pharmacy is requesting more than her previous dose.     Let me know if you have any questions.    Thank you,     Felisa Bonier, FNP-C

## 2019-12-02 NOTE — Telephone Encounter (Signed)
Spoke with patient, advised of message.

## 2019-12-02 NOTE — Telephone Encounter (Signed)
Please call pt and find out what she takes her lasix for. CHF?  Also, please ask if she was previously getting this rx from her cardiologist.   If she needs a refill in the mean time I will send her a 30 day supply but would like her to follow up with her cardiologist to ensure this medication is appropriately monitored.     Let me know if you have any questions.    Thank you,     Felisa Bonier, FNP-C

## 2019-12-02 NOTE — Telephone Encounter (Signed)
Patient states she takes this medicine for CHF and will f/u with cardiologist, but needs a refill until then. Thanks

## 2019-12-03 ENCOUNTER — Encounter (INDEPENDENT_AMBULATORY_CARE_PROVIDER_SITE_OTHER): Payer: Self-pay | Admitting: Family Medicine

## 2019-12-05 ENCOUNTER — Other Ambulatory Visit: Payer: Self-pay | Admitting: Critical Care Medicine

## 2019-12-09 ENCOUNTER — Ambulatory Visit: Payer: Self-pay

## 2019-12-11 ENCOUNTER — Ambulatory Visit: Payer: Self-pay

## 2019-12-11 DIAGNOSIS — E669 Obesity, unspecified: Secondary | ICD-10-CM

## 2019-12-11 NOTE — Progress Notes (Signed)
Medical Weight Loss Follow Up Appointment with RD    Caitlin Malone is a 64 y.o. female  Appointment Date: 12/11/2019      Height : 5\' 6"   Current Weight: 237 lbs. (self reported)  BMI:  38.2       Weight Loss: 6.8 lbs.    Notes: Verbal consent has been obtained to conduct appointment virtually due to COVID-19.    States she is not losing weight. She has been taking 2 MR /day, and includes salad with chicken or fish, Greek yogurt and berries in her diet. She reports consuming about 900 calories/day. Patient states she wants to continue on the same meal plan however, since she has been on a protein and fluid restriction per nephrologist, she will need to consult with her doctor and get more labs done to determine whether she can continue on the products. Reviewed all food meal plan to follow for when she will be off the products.   States she had  been attending program exercise classes but recently tore her rotator cuff so she has not done any exercises. States she is doing physical therapy 2 x week. States she walks around the house for about 30 minutes.    States she continues to use food diary app to track her nutrient and calorie intake and maintain the limits on her protein restriction.  Questions answered.    Alben Spittle, MS RD  Nutrition Educator  [facility]

## 2019-12-16 ENCOUNTER — Ambulatory Visit: Payer: Self-pay

## 2019-12-16 ENCOUNTER — Encounter (INDEPENDENT_AMBULATORY_CARE_PROVIDER_SITE_OTHER): Payer: Self-pay | Admitting: Family Medicine

## 2019-12-23 ENCOUNTER — Ambulatory Visit: Payer: Self-pay

## 2019-12-29 ENCOUNTER — Encounter (INDEPENDENT_AMBULATORY_CARE_PROVIDER_SITE_OTHER): Payer: Self-pay | Admitting: Family Medicine

## 2019-12-30 ENCOUNTER — Ambulatory Visit: Payer: Self-pay

## 2020-01-05 ENCOUNTER — Other Ambulatory Visit (INDEPENDENT_AMBULATORY_CARE_PROVIDER_SITE_OTHER): Payer: Self-pay | Admitting: Family Medicine

## 2020-01-05 NOTE — Telephone Encounter (Signed)
Lm on cell # vc mail-  Pt was advised to cons w/ cardiology w/ re: continuing on Lasix  Req pt call back w/ cardio plan, if she has not seen them, rec she f/u to discuss med

## 2020-01-06 ENCOUNTER — Ambulatory Visit: Payer: Self-pay

## 2020-01-26 ENCOUNTER — Encounter (INDEPENDENT_AMBULATORY_CARE_PROVIDER_SITE_OTHER): Payer: Self-pay | Admitting: Family Medicine

## 2020-02-23 ENCOUNTER — Encounter (INDEPENDENT_AMBULATORY_CARE_PROVIDER_SITE_OTHER): Payer: Self-pay | Admitting: Family Medicine

## 2020-03-09 ENCOUNTER — Encounter (INDEPENDENT_AMBULATORY_CARE_PROVIDER_SITE_OTHER): Payer: Self-pay | Admitting: Family Medicine

## 2020-03-09 ENCOUNTER — Ambulatory Visit (INDEPENDENT_AMBULATORY_CARE_PROVIDER_SITE_OTHER): Payer: No Typology Code available for payment source | Admitting: Family Medicine

## 2020-03-09 VITALS — BP 130/80 | HR 80 | Temp 98.2°F | Resp 16 | Wt 237.0 lb

## 2020-03-09 DIAGNOSIS — N1831 Chronic kidney disease, stage 3a: Secondary | ICD-10-CM

## 2020-03-09 DIAGNOSIS — R7989 Other specified abnormal findings of blood chemistry: Secondary | ICD-10-CM

## 2020-03-09 DIAGNOSIS — T148XXA Other injury of unspecified body region, initial encounter: Secondary | ICD-10-CM

## 2020-03-09 DIAGNOSIS — E1122 Type 2 diabetes mellitus with diabetic chronic kidney disease: Secondary | ICD-10-CM

## 2020-03-09 DIAGNOSIS — I10 Essential (primary) hypertension: Secondary | ICD-10-CM

## 2020-03-09 DIAGNOSIS — E1121 Type 2 diabetes mellitus with diabetic nephropathy: Secondary | ICD-10-CM

## 2020-03-09 NOTE — Patient Instructions (Addendum)
Back Sprain or Strain    Injury to the muscles (strain) or ligaments (sprain) around the spine canbe troubling. Injury may occur after a sudden forceful twisting or bending such as in a car accident, after a simple awkward movement, or after lifting something heavy with poor body positioning. Inany case, muscle spasm is often present and adds to the pain.  Thankfully, most people feel better in 1 to 2 weeks. Most of the rest feel better in 1 to 2 months. Most people can remain active. Unless you had a forceful or traumatic physical injury such asa car accident or fall, X-rays may not be done for the first assessment of a back sprain or strain. If pain continues and doesn't respond to medical treatment, your healthcare provider may then do X-rays and other tests.  Home care  These guidelines will help you care for your injury at home:   When in bed, try to find a comfortable position. A firm mattress is best. Try lying flat on your back with pillows under your knees. You can also try lying on your side with your knees bent up toward your chest and a pillow between your knees.   Don't sit for long periods. Try not to take long car rides or take other trips that have you sitting for a long time. This puts more stress on the lower back than standing or walking.   During the first 24 to 72 hours after an injury or flare-up, put an ice pack on the painful area for 20 minutes. Then remove it for 20 minutes. Do this for60 to 90 minutes, or several times a day. This will reduce swelling and pain. Always wrap the ice pack in a thin towel or plastic to protect your skin.   You can start with ice, then switch toheat. Heat from a hot shower, hot bath, or heating pad reduces pain and works well for muscle spasms. Put heat on the painful area for 20 minutes, then remove for 20 minutes.Do this for 60to 90 minutes, or several times a day. Don't use a heating pad while sleeping. It can burn the skin.   You can  alternate theice and heat. Talk with your healthcare provider to find out the best treatment or therapy for your back pain.   Therapeutic massage can help relax the back muscles without stretching them.   Be aware of safe lifting methods. Don't lift anything over 15 pounds until all of the pain is gone.  Medicines  Talk with your healthcare provider before using medicines, especially if you have other health problems or are taking other medicines.   You may use over-the-counter medicines such as acetaminophen, ibuprofen, or naproxen to control pain, unless another pain medicine was prescribed. Talk with your healthcare provider before taking any medicines if you have a chronic condition such as diabetes, liver or kidney disease, stomach ulcers, or digestive bleeding, or are taking blood-thinner medicines.   Be careful if you are given prescription medicines, opioids, or medicine for muscle spasm. They can cause drowsiness, and affect your coordination, reflexes, and judgment. Don't drive or operate heavy machinery when taking these types of medicines. Only take pain medicine as prescribed by your healthcare provider.  Follow-up care  Follow up with your healthcare provider, or as advised. You may need physical therapy or more testsif your symptoms get worse.  If you had X-rays, your healthcare provider may be checking for any broken bones, breaks, or fractures. Bruises and sprains can   sometimes hurt as much as a fracture. These injuries can take time to heal fully. If your symptoms don't get better or they get worse, talk with your healthcare provider. You may need a repeat X-ray or other tests.  Call 911  Call 911 if any of the following occur:   Trouble breathing   Confused   Very drowsy or trouble awakening   Fainting or loss of consciousness   Rapid or very slow heart rate   Loss of bowel or bladder control  When to seek medical advice  Call your healthcare provider right away if any of the following  occur:   Pain gets worse or spreads to your arms or legs   Weakness or numbness in one or both arms or legs   Numbness in the groin or genital area  StayWell last reviewed this educational content on 05/24/2018   2000-2021 [employer], [place]. All rights reserved. This information is not intended as a substitute for professional medical care. Always follow your healthcare professional's instructions.        Thoracic Spine Strain  The thoracic spine is the middle part of the back between the neck and the lower back. A thoracic spine strain is due to stretching and tearing of the muscle fibers that support the spine. This may happen because of severe coughing or heavy lifting. Or it may be caused by twisting injuries of the upper back, such as from a fall or a car or bike accident.  This often causes increased pain when you move or breathe deeply. This may take 3 to 6 weeks or longer to heal.  Home care   Rest. Avoid heavy lifting or strenuous work. Avoid any activity that causes pain.   You may find relief withheat(hot shower, hot bath or heating pad) andmassage. Or you may prefercold packs. Try both and use the method that feels best for 20 minutes several times a day. To make an ice pack, put ice cubes in a plasticbag that seals at the top. Wrap the bag in aclean, thintowel or cloth. Never put ice or an ice pack directly on your skin.   If you have a severe cough, use an over-the-countercoughmedicineunless another cough medicine was prescribed.   You may useover-the-counter pain medicineto control pain, unless another medicine was prescribed. Talk with your provider before using these medicines if you have chronic liver or kidney disease or ever had a stomach ulcer or gastrointestinal bleeding.  Follow-up care  Follow up with your healthcare provider, or as directed.  When to seek medical advice  Call your healthcare provider right away if you have:   A change in the type of pain: if it  feels different, becomes more severe, lasts longer, or begins to spread into your shoulder, arm, neck, jaw or back  Call 911  Call 911 if you have:   Shortness of breath or increased pain with breathing   Cough with dark colored sputum (phlegm) or blood   Weakness, dizziness, or fainting   Numbness or weakness in one or both legs or arms  StayWell last reviewed this educational content on 11/21/2016   2000-2021 [employer], [place]. All rights reserved. This information is not intended as a substitute for professional medical care. Always follow your healthcare professional's instructions.

## 2020-03-09 NOTE — Progress Notes (Signed)
[facility] [place] [facility]                       Date of Exam: 03/09/2020 4:03 PM        Patient ID: Caitlin Malone is a 64 y.o. female.  Attending Physician: Tyrell Antonio, FNP           This note was dictated using Dragon Medical One software, by [employer], a voice recognition speech to text software, and may contain inadvertent recognition errors. There may be inherent errors or limitations not intended by the user. Not all errors are caught or corrected. If there are questions or concerns about the content of this note or the information contained within the body of this dictation they should be addressed directly with the author for clarification.    Subjective:   Chief Complaint:Patient is a 64 y.o. female seen in the office today for   Chief Complaint   Patient presents with   . Circulatory Problem   . Labs Only     pt would like to have labs done befoe doing a diet    . Back Pain     LEFT UPPER BCK PAIN        HPI:  HPI     Patient is a 63 y.o. female seen in the office today for several complaints.  1. Circulation right foot:  Pt had an evaluation with medicare and said that her circulation was poor in her right foot. Pt has not had any s/s, She has been taking her elequis regularly. No swelling. No pain. She states that she does not have the results with her and I have not received her test results yet.  Denies any swelling or discoloration.    2.  Weight loss  Pt request labs prior to starting a diet. Her last bw was 11/18/2019. She is going to be doing [facility] weight [facility]. Pt has had elevated creatine in the past that went back down.  She has recently seen a nephrologist.  Patient wants to know where she is at before she starts a high protein diet.   She is having a weight loss consultation Thursday.   Pt has been working on portions and exercise.   She has lost about 6 lbs since April.     3htn/diabetes  Pt has a hx of htn, diabetes type 2. She currently takes  olmesartan-hydrochlorothiazide (BENICAR HCT) 40-25 MG per tablet. Pt has a hx of dilated cardiomyopathy with preserved ejection fraction. She Recently was seen by nephrologist due to acute renal insufficiency.  who suggested [place] lasix and continuing to monitor bp and weight .    4.Back pain  presents for s/s x 1 week left  Upper back pain-towards the middle.   The patient describes the pain as ache pain long her upper left middle back region.  The pain is Constant but gets worse with motion.   moi- no  Relieving factors: heat and tylenol With relief.   Patient is managed by pain specialist for other chronic issues.  She uses buprenorphine patches.  The patient denies any associated s/s. Denies s/s of radiating pain, numbness, tingling, nausea, vomiting, fever, bowel or bladder incontinence, urinary retention, loss of anal sphincter tone, or saddle anesthesia. Patient denies a history of cancer or osteoarthritis.    Problem List:  Patient Active Problem List   Diagnosis   . Type 2 diabetes mellitus without complication   .  Obstructive sleep apnea syndrome   . Morbid obesity   . Hypertensive disorder   . Encounter for weight loss counseling   . Edema of lower extremity   . Diabetes mellitus   . Congestive heart failure   . Arthritis of knee   . Arthritis   . Snoring   . History of pulmonary embolism   . Type 2 diabetes mellitus with diabetic chronic kidney disease       Current Medications:  Current Outpatient Medications   Medication Sig Dispense Refill   . Buprenorphine (BUTRANS) 5 MCG/HR Patch Weekly 1 patch     . Eliquis 5 MG Take 5 mg by mouth daily     . Ferrous Sulfate (IRON PO) Take by mouth     . furosemide (LASIX) 40 MG tablet TAKE 1 TABLET BY MOUTH EVERY DAY 30 tablet 0   . latanoprost (XALATAN) 0.005 % ophthalmic solution latanoprost 0.005 % eye drops     . Loratadine (CLARITIN PO) Take by mouth daily     . montelukast (SINGULAIR) 10 MG tablet montelukast 10 mg tablet   TK 1 T PO  HS     . VITAMIN D PO Take  by mouth     . gabapentin (NEURONTIN) 300 MG capsule TAKE 1 CAPSULE BY MOUTH EVERY MORNING AND 2 CAPSULES BY MOUTH AT BEDTIME     . hydroxychloroquine (PLAQUENIL) 200 MG tablet Take 200 mg by mouth 2 (two) times daily     . olmesartan-hydrochlorothiazide (BENICAR HCT) 40-25 MG per tablet Take 1 tablet by mouth daily       No current facility-administered medications for this visit.       Allergies:  No Known Allergies    Immunizations:  Immunization History   Administered Date(s) Administered   . COVID-19 mRNA Vaccine Preservative Free 0.3 mL (PFIZER) 10/11/2019, 10/23/2019   . INFLUENZA HIGH DOSE 65 YRS+ 07/24/2012   . Influenza (Im) Preservative Free 04/27/2015, 06/01/2016   . Influenza quadrivalent (IM) 6 months & up PRESERVED 04/23/2018, 05/24/2018   . Pneumococcal Conjugate, NOS 05/24/2016        Past Medical History:  Past Medical History:   Diagnosis Date   . Asthma    . Congestive heart failure    . Gastroesophageal reflux disease    . History of Chiari malformation 03/19/2019    [facility] ER, dx after fall   . Hypertension    . Prediabetes        Past Surgical History:  Past Surgical History:   Procedure Laterality Date   . KNEE SURGERY Right 2012       Family History:  Family History   Problem Relation Age of Onset   . Cancer Mother    . Diabetes Mother    . Arthritis Mother    . Obesity Mother    . Cancer Maternal Grandfather        Social History:  Social History     Socioeconomic History   . Marital status: Married     Spouse name: Not on file   . Number of children: Not on file   . Years of education: Not on file   . Highest education level: Not on file   Occupational History   . Not on file   Tobacco Use   . Smoking status: Never Smoker   . Smokeless tobacco: Never Used   Vaping Use   . Vaping Use: Never used   Substance and Sexual Activity   .  Alcohol use: Not Currently   . Drug use: Never   . Sexual activity: Not on file   Other Topics Concern   . Dietary supplements / vitamins No   .  Anesthesia problems No   . Blood thinners No   . Pregnant Not Asked   . Future Children Not Asked   . Number of Pregnancies? Not Asked   . Number of children Not Asked   . Miscarriages / Abortions? Not Asked   . Eats large amounts No   . Excessive Sweets No   . Skips meals No   . Eats excessive starches No   . Snacks or grazes Yes   . Emotional eater Yes   . Eats fried food No   . Eats fast food No   . Diet Center No   . Doylene Bode No   . [place] [facility] No   . Nutri-System No   . Opti-Fast / Medi-Fast No   . Overeaters Anonymous No   . [facility] Yes   . TOPS No   . Weight Watchers No   . Atkins No   . Binging / Purging No   . Calorie Counting No   . Fasting No   . High Protein Yes   . Low Carb Yes   . Low Fat Yes   . [facility] Diet No   . Slim Fast No   . [place] No   . Stationary cycle or treadmill No   . Gym/fitness Classes No   . Home exercise/video No   . Swimming No   . Weight training No   . Walking or running Yes   . Hospitalization No   . Hypnosis No   . Physical therapy No   . Psychological therapy No   . Residential program No   . Acutrim No   . [place] No   . Contrave No   . Dexatrim No   . Diethylpropion No   . Fastin No   . Fen - Phen No   . Ionamin / Adipex No   . Phentermine No   . Qsymia No   . Prozac No   . Saxenda No   . Topamax No   . Wellbutrin No   . Xenical (Orlistat, Alli) No   . Other Med No   . No impairment No   . Walks with cane/crutch No   . Requires a wheelchair No   . Bedridden No   . Are you currently being treated for depression? No   . Do you snore? No   . Are you receiving any medical or psychological services? No   . Do you ever wake up at night gasping for breath? No   . Do you have or have you been treated for an eating disorder? No   . Anyone ever told you that you stop breathing while asleep? No   . Do you exercise regularly? Yes   . Have you or family member ever have trouble with anesthesia? No   Social History Narrative   . Not on file     Social  Determinants of Health     Financial Resource Strain:    . Difficulty of Paying Living Expenses:    Food Insecurity:    . Worried About Programme researcher, broadcasting/film/video in the Last Year:    . Barista in the Last Year:    Transportation Needs:    .  Lack of Transportation (Medical):    Marland Kitchen Lack of Transportation (Non-Medical):    Physical Activity:    . Days of Exercise per Week:    . Minutes of Exercise per Session:    Stress:    . Feeling of Stress :    Social Connections:    . Frequency of Communication with Friends and Family:    . Frequency of Social Gatherings with Friends and Family:    . Attends Religious Services:    . Active Member of Clubs or Organizations:    . Attends Banker Meetings:    Marland Kitchen Marital Status:    Intimate Partner Violence:    . Fear of Current or Ex-Partner:    . Emotionally Abused:    Marland Kitchen Physically Abused:    . Sexually Abused:        The following sections were reviewed this encounter by the provider:        ROS:  Review of Systems   Review of systems as noted in the HPI.    Vitals:  BP 130/80   Pulse 80   Temp 98.2 F (36.8 C)   Resp 16   Wt 107.5 kg (237 lb)   BMI 38.25 kg/m      Objective:     Physical Exam:     Constitutional:      No apparent distress   Cardiovascular:      Rate and Rhythm: Normal rate and regular rhythm. Normal S1 and S2, no murmur     Pulses: Normal pulses.   Pulmonary:      Effort: Pulmonary effort is normal.      Breath sounds: Normal breath sounds     Lungs: Clear to auscultation bilaterally without wheezing or rhonchi    Musculoskeletal:    Cervical back: Normal.      Thoracic back: Normal.      Lumbar back: Normal.        Back:       Comments: Tenderness to palpation over above area.  No visible swelling.  No bruising no deformity   Extremities: No edema  Psychiatric:         Mood and Affect: Mood normal.         Behavior: Behavior normal.         Thought Content: Thought content normal.         Judgment: Judgment normal.        Assessment:     1.  Elevated serum creatinine  - Comprehensive metabolic panel; Future    2. Type 2 diabetes mellitus with stage 3a chronic kidney disease, without long-term current use of insulin  - Lipid Profile with Reflex to LDL Direct; Future  - Comprehensive metabolic panel; Future    3. Hypertension, unspecified type  - CBC and differential; Future  - Comprehensive metabolic panel; Future    4. Muscle strain    5. Morbid obesity  - Lipid Profile with Reflex to LDL Direct; Future      Plan:   Blood work ordered.  Provider will notify patient of blood work results.  Patient to fax results from Medicare wellness visit.  Patient to use OTC heat stretches  Patient advised topical Voltaren.  Patient advised to follow-up in 4 to 6 weeks if symptoms persist or sooner if worsen  Patient to continue medication as prescribed  Patient to monitor blood sugar levels and blood pressure.  Care and red flag sxs reviewed  Healthy lifestyle discussed  with patient  Pt will contact provider or f/u sooner if sxs persist or worsen       Tyrell Antonio, FNP

## 2020-03-22 ENCOUNTER — Encounter (INDEPENDENT_AMBULATORY_CARE_PROVIDER_SITE_OTHER): Payer: Self-pay | Admitting: Family Medicine

## 2020-04-19 ENCOUNTER — Encounter (INDEPENDENT_AMBULATORY_CARE_PROVIDER_SITE_OTHER): Payer: Self-pay | Admitting: Family Medicine

## 2020-05-17 ENCOUNTER — Encounter (INDEPENDENT_AMBULATORY_CARE_PROVIDER_SITE_OTHER): Payer: Self-pay | Admitting: Family Medicine

## 2020-06-14 ENCOUNTER — Encounter (INDEPENDENT_AMBULATORY_CARE_PROVIDER_SITE_OTHER): Payer: Self-pay | Admitting: Family Medicine

## 2020-06-15 ENCOUNTER — Encounter (INDEPENDENT_AMBULATORY_CARE_PROVIDER_SITE_OTHER): Payer: Self-pay | Admitting: Family Medicine

## 2020-06-22 ENCOUNTER — Encounter (INDEPENDENT_AMBULATORY_CARE_PROVIDER_SITE_OTHER): Payer: No Typology Code available for payment source | Admitting: Family Medicine

## 2020-06-22 NOTE — Progress Notes (Deleted)
[facility] [place] [facility]                       Date of Exam: 06/22/2020 8:03 AM        Patient ID: Caitlin Malone is a 64 y.o. female.  Attending Physician: Tyrell Antonio, FNP           Subjective:   Chief Complaint:Patient is here for preoperative exam.   No chief complaint on file.      HPI:  HPI   1. Preoperative exam:  Surgery/procedure: *  Indication for surgery/procedure: *  Date of surgery/procedure:*  Location/center: *  Surgeon: *  Anesthesia type*    Risk Factors no history of surgical complications; no easy bleeding tendency; no history of blood clots; no history of allergic reaction to anesthetic agents.    Current Symptoms: no unusual fatigue; normal appetite; no fever; no chest pain; no palpitations; no unexplained syncope; no shortness of breath; no reduction in exercise tolerance due to chest pain or dyspnea; no upper respiratory infection; no wheezing; no atypical bleeding; no unusual lower extremity edema/pain; no abdominal pain; normal bowel function; no dysuria/urinary symptoms.      Pt has a hx of htn, diabetes type 2. She currently takes olmesartan-hydrochlorothiazide (BENICAR HCT) 40-25 MG per tablet. Pt has a hx of dilated cardiomyopathy with preserved ejection fraction.     Problem List:  Patient Active Problem List   Diagnosis   . Type 2 diabetes mellitus without complication   . Obstructive sleep apnea syndrome   . Morbid obesity   . Hypertensive disorder   . Encounter for weight loss counseling   . Edema of lower extremity   . Diabetes mellitus   . Congestive heart failure   . Arthritis of knee   . Arthritis   . Snoring   . History of pulmonary embolism   . Type 2 diabetes mellitus with diabetic chronic kidney disease       Current Medications:  Current Outpatient Medications   Medication Sig Dispense Refill   . Buprenorphine (BUTRANS) 5 MCG/HR Patch Weekly 1 patch     . Eliquis 5 MG Take 5 mg by mouth daily     . Ferrous Sulfate (IRON PO)  Take by mouth     . furosemide (LASIX) 40 MG tablet TAKE 1 TABLET BY MOUTH EVERY DAY 30 tablet 0   . gabapentin (NEURONTIN) 300 MG capsule TAKE 1 CAPSULE BY MOUTH EVERY MORNING AND 2 CAPSULES BY MOUTH AT BEDTIME     . hydroxychloroquine (PLAQUENIL) 200 MG tablet Take 200 mg by mouth 2 (two) times daily     . latanoprost (XALATAN) 0.005 % ophthalmic solution latanoprost 0.005 % eye drops     . Loratadine (CLARITIN PO) Take by mouth daily     . montelukast (SINGULAIR) 10 MG tablet montelukast 10 mg tablet   TK 1 T PO  HS     . olmesartan-hydrochlorothiazide (BENICAR HCT) 40-25 MG per tablet Take 1 tablet by mouth daily     . VITAMIN D PO Take by mouth       No current facility-administered medications for this visit.       Allergies:  No Known Allergies    Immunizations:  Immunization History   Administered Date(s) Administered   . COVID-19 mRNA Vaccine Preservative Free 0.3 mL (PFIZER) 10/11/2019, 10/23/2019   . INFLUENZA HIGH DOSE 65 YRS+ 07/24/2012   . Influenza (Im) Preservative  Free 04/27/2015, 06/01/2016   . Influenza quadrivalent (IM) 6 months & up PRESERVED (Afluria/Fluzone) 04/23/2018, 05/24/2018   . Pneumococcal Conjugate, NOS 05/24/2016        Past Medical History:  Past Medical History:   Diagnosis Date   . Asthma    . Congestive heart failure    . Gastroesophageal reflux disease    . History of Chiari malformation 03/19/2019    [facility], dx after fall   . Hypertension    . Prediabetes        Past Surgical History:  Past Surgical History:   Procedure Laterality Date   . KNEE SURGERY Right 2012       Family History:  Family History   Problem Relation Age of Onset   . Cancer Mother    . Diabetes Mother    . Arthritis Mother    . Obesity Mother    . Cancer Maternal Grandfather        Social History:  Social History     Socioeconomic History   . Marital status: Married     Spouse name: Not on file   . Number of children: Not on file   . Years of education: Not on file   . Highest education level: Not  on file   Occupational History   . Not on file   Tobacco Use   . Smoking status: Never Smoker   . Smokeless tobacco: Never Used   Vaping Use   . Vaping Use: Never used   Substance and Sexual Activity   . Alcohol use: Not Currently   . Drug use: Never   . Sexual activity: Not on file   Other Topics Concern   . Dietary supplements / vitamins No   . Anesthesia problems No   . Blood thinners No   . Pregnant Not Asked   . Future Children Not Asked   . Number of Pregnancies? Not Asked   . Number of children Not Asked   . Miscarriages / Abortions? Not Asked   . Eats large amounts No   . Excessive Sweets No   . Skips meals No   . Eats excessive starches No   . Snacks or grazes Yes   . Emotional eater Yes   . Eats fried food No   . Eats fast food No   . Diet Center No   . Doylene Bode No   . LA Weight Loss No   . Nutri-System No   . Opti-Fast / Medi-Fast No   . Overeaters Anonymous No   . [facility] Yes   . TOPS No   . Weight Watchers No   . Atkins No   . Binging / Purging No   . Calorie Counting No   . Fasting No   . High Protein Yes   . Low Carb Yes   . Low Fat Yes   . Mayo [facility] Diet No   . Slim Fast No   . [place] No   . Stationary cycle or treadmill No   . Gym/fitness Classes No   . Home exercise/video No   . Swimming No   . Weight training No   . Walking or running Yes   . Hospitalization No   . Hypnosis No   . Physical therapy No   . Psychological therapy No   . Residential program No   . Acutrim No   . Byetta No   . Contrave No   .  Dexatrim No   . Diethylpropion No   . Fastin No   . Fen - Phen No   . Ionamin / Adipex No   . Phentermine No   . Qsymia No   . Prozac No   . Saxenda No   . Topamax No   . Wellbutrin No   . Xenical (Orlistat, Alli) No   . Other Med No   . No impairment No   . Walks with cane/crutch No   . Requires a wheelchair No   . Bedridden No   . Are you currently being treated for depression? No   . Do you snore? No   . Are you receiving any medical or psychological services? No    . Do you ever wake up at night gasping for breath? No   . Do you have or have you been treated for an eating disorder? No   . Anyone ever told you that you stop breathing while asleep? No   . Do you exercise regularly? Yes   . Have you or family member ever have trouble with anesthesia? No   Social History Narrative   . Not on file     Social Determinants of Health     Financial Resource Strain:    . Difficulty of Paying Living Expenses: Not on file   Food Insecurity:    . Worried About Programme researcher, broadcasting/film/video in the Last Year: Not on file   . Ran Out of Food in the Last Year: Not on file   Transportation Needs:    . Lack of Transportation (Medical): Not on file   . Lack of Transportation (Non-Medical): Not on file   Physical Activity:    . Days of Exercise per Week: Not on file   . Minutes of Exercise per Session: Not on file   Stress:    . Feeling of Stress : Not on file   Social Connections:    . Frequency of Communication with Friends and Family: Not on file   . Frequency of Social Gatherings with Friends and Family: Not on file   . Attends Religious Services: Not on file   . Active Member of Clubs or Organizations: Not on file   . Attends Banker Meetings: Not on file   . Marital Status: Not on file   Intimate Partner Violence:    . Fear of Current or Ex-Partner: Not on file   . Emotionally Abused: Not on file   . Physically Abused: Not on file   . Sexually Abused: Not on file   Housing Stability:    . Unable to Pay for Housing in the Last Year: Not on file   . Number of Places Lived in the Last Year: Not on file   . Unstable Housing in the Last Year: Not on file       The following sections were reviewed this encounter by the provider:        ROS:  Review of Systems     Review of Systems   General/Constitutional:           Denies Chills.  Denies Fatigue.  Denies Fever.       Ophthalmologic:           Denies Blurred vision.       ENT:           Denies Nasal Discharge.  Denies Sinus pain.  Denies Sore  throat.  Endocrine:           Denies Polydipsia.  Denies Polyuria.       Respiratory:           Denies Cough.  Denies Orthopnea.  Denies Shortness of breath.  Denies Wheezing.       Cardiovascular:           Denies Chest pain.  Denies Chest pain with exertion.  Denies Palpitations.  Denies Swelling in hands/feet.       Gastrointestinal:           Denies Abdominal pain.  Denies Constipation.  Denies Diarrhea.  Denies Nausea.  Denies Vomiting.       Hematology:           Denies Easy bruising.  Denies Easy Bleeding.       Genitourinary:           Denies Blood in urine.  Denies Painful urination.       Peripheral Vascular:           Denies Pain/cramping in legs after exertion.  Denies Painful extremities.       Skin:           Denies Rash.       Neurologic:           Denies Dizziness.  Denies Pre-Syncope.  Denies Tingling/Numbness.       Psychiatric:           Denies Anxiety.  Denies Depressed mood.        Vitals:  There were no vitals taken for this visit.     Objective:     Physical Exam:  Physical Exam   GENERAL APPEARANCE: alert, in no acute distress, well developed, well nourished, oriented to time, place, and person.   HEAD: normal appearance.   EYES: extraocular movement intact (EOMI), pupils equal, round, reactive to light and accommodation, sclera anicteric.   EARS: tympanic membranes normal bilaterally, external canals normal .   NOSE: normal nasal mucosa, nares patent.   ORAL CAVITY: normal oropharynx.   THROAT: no erythema, no exudate.   NECK/THYROID: neck supple, carotid pulse 2+ bilaterally, no lymphadenopathy, no thyromegaly.   SKIN: no rashes.   HEART: S1, S2 normal, no murmurs, rubs, gallops, regular rate and rhythm.   LUNGS: normal effort / no distress, normal breath sounds, clear to auscultation bilaterally, no wheezes, rales, rhonchi.   ABDOMEN: bowel sounds present, no hepatosplenomegaly, soft, nontender, nondistended.   EXTREMITIES: no clubbing, cyanosis, or edema.   PERIPHERAL PULSES: 2+  dorsalis pedis, 2+ posterior tibial.   NEUROLOGIC: nonfocal, cranial nerves 2-12 grossly intact, deep tendon reflexes 2+ symmetrical, normal strength, tone and reflexes, sensory exam intact.   PSYCH: alert, oriented, cognitive function intact.     Assessment:     There are no diagnoses linked to this encounter.    Plan:   1.Preoperative Evaluation:  EKG ordered and reviewed.    EKG Findings: normal EKG, normal sinus rhythm, no acute ST-T changes**  Labs as above per surgeon's request.  If labs and studies within normal/stable, patient is cleared for surgery.  Surgery Specific Risk:    ASA Classification:   Preoperative Status:  {Disease state:46112}  Recommendations:    Pt to stop taking any NSAIDS, blood thinners, supplements including but not limited to vitamin E, ginkgo, ginseng, garlic, or fish oil, saw palmetto, st johns wart 10-14 days prior to  procedure.    Tyrell Antonio, FNP

## 2020-06-23 ENCOUNTER — Encounter (INDEPENDENT_AMBULATORY_CARE_PROVIDER_SITE_OTHER): Payer: Self-pay | Admitting: Family Medicine

## 2020-06-23 ENCOUNTER — Ambulatory Visit (INDEPENDENT_AMBULATORY_CARE_PROVIDER_SITE_OTHER): Payer: No Typology Code available for payment source | Admitting: Family Medicine

## 2020-06-23 VITALS — BP 116/76 | HR 83 | Resp 20 | Ht 65.0 in | Wt 239.0 lb

## 2020-06-23 DIAGNOSIS — Z86711 Personal history of pulmonary embolism: Secondary | ICD-10-CM

## 2020-06-23 DIAGNOSIS — E1122 Type 2 diabetes mellitus with diabetic chronic kidney disease: Secondary | ICD-10-CM

## 2020-06-23 DIAGNOSIS — Z01818 Encounter for other preprocedural examination: Secondary | ICD-10-CM

## 2020-06-23 DIAGNOSIS — I10 Essential (primary) hypertension: Secondary | ICD-10-CM

## 2020-06-23 DIAGNOSIS — G4733 Obstructive sleep apnea (adult) (pediatric): Secondary | ICD-10-CM

## 2020-06-23 NOTE — Progress Notes (Signed)
[facility] [place] [facility]                       Date of Exam: 06/23/2020 3:41 PM        Patient ID: Caitlin Malone is a 64 y.o. female.  Attending Physician: Tyrell Antonio, FNP           Subjective:   Chief Complaint:Patient is here for preoperative exam.   Chief Complaint   Patient presents with   . Pre-op Exam     cataract, Dr. Rayburn Go office [phone], fax [phone] DOS 07/07/20       HPI:  HPI   1. Preoperative exam:  Surgery/procedure: Left cataract surgery.   Date of surgery/procedure:07/07/20  Location/center:  [place], [place] [phone], fax [phone]   Surgeon: Dr. Rayburn Go     Risk Factors no history of surgical complications;no history of allergic reaction to anesthetic agents.    Current Symptoms: no unusual fatigue; normal appetite; no fever; no chest pain; no palpitations; no unexplained syncope; no shortness of breath; no reduction in exercise tolerance due to chest pain or dyspnea; no upper respiratory infection; no wheezing; no atypical bleeding; no unusual lower extremity edema/pain; no abdominal pain; normal bowel function; no dysuria/urinary symptoms.     Pt has a hx of Type 2 diabetes mellitus with stage 3a chronic kidney disease, Hypertension, CHF, and Obstructive Sleep Apnea.Pt also has a hx of a PE. Her most recent cmp 06-15-20- kidney function results were wnl. Pt takes all medications as rx. Denies any problems with her medications.  Blood pressure well controlled  She is followed by nephrology regularly.       Problem List:  Patient Active Problem List   Diagnosis   . Type 2 diabetes mellitus without complication   . Obstructive sleep apnea syndrome   . Morbid obesity   . Hypertensive disorder   . Encounter for weight loss counseling   . Edema of lower extremity   . Diabetes mellitus   . Congestive heart failure   . Arthritis of knee   . Arthritis   . Snoring   . History of pulmonary embolism   . Type 2 diabetes mellitus with  diabetic chronic kidney disease       Current Medications:  Current Outpatient Medications   Medication Sig Dispense Refill   . Buprenorphine (BUTRANS) 5 MCG/HR Patch Weekly 1 patch     . Eliquis 5 MG Take 5 mg by mouth daily     . Ferrous Sulfate (IRON PO) Take by mouth     . furosemide (LASIX) 40 MG tablet TAKE 1 TABLET BY MOUTH EVERY DAY 30 tablet 0   . gabapentin (NEURONTIN) 300 MG capsule TAKE 1 CAPSULE BY MOUTH EVERY MORNING AND 2 CAPSULES BY MOUTH AT BEDTIME     . hydroxychloroquine (PLAQUENIL) 200 MG tablet Take 200 mg by mouth 2 (two) times daily     . latanoprost (XALATAN) 0.005 % ophthalmic solution latanoprost 0.005 % eye drops     . Loratadine (CLARITIN PO) Take by mouth daily     . montelukast (SINGULAIR) 10 MG tablet montelukast 10 mg tablet   TK 1 T PO  HS     . olmesartan-hydrochlorothiazide (BENICAR HCT) 40-25 MG per tablet Take 1 tablet by mouth daily     . VITAMIN D PO Take by mouth       No current facility-administered medications for this visit.  Allergies:  No Known Allergies    Immunizations:  Immunization History   Administered Date(s) Administered   . COVID-19 mRNA Vaccine Preservative Free 0.3 mL (PFIZER) 10/11/2019, 10/23/2019   . INFLUENZA HIGH DOSE 65 YRS+ 07/24/2012   . Influenza (Im) Preservative Free 04/27/2015, 06/01/2016   . Influenza quadrivalent (IM) 6 months & up PRESERVED (Afluria/Fluzone) 04/23/2018, 05/24/2018   . Pneumococcal Conjugate, NOS 05/24/2016        Past Medical History:  Past Medical History:   Diagnosis Date   . Asthma    . Congestive heart failure    . Gastroesophageal reflux disease    . History of Chiari malformation 03/19/2019    [facility] ER, dx after fall   . Hypertension    . Prediabetes        Past Surgical History:  Past Surgical History:   Procedure Laterality Date   . KNEE SURGERY Right 2012       Family History:  Family History   Problem Relation Age of Onset   . Cancer Mother    . Diabetes Mother    . Arthritis Mother    . Obesity Mother     . Cancer Maternal Grandfather        Social History:  Social History     Socioeconomic History   . Marital status: Married     Spouse name: Not on file   . Number of children: Not on file   . Years of education: Not on file   . Highest education level: Not on file   Occupational History   . Not on file   Tobacco Use   . Smoking status: Never Smoker   . Smokeless tobacco: Never Used   Vaping Use   . Vaping Use: Never used   Substance and Sexual Activity   . Alcohol use: Not Currently   . Drug use: Never   . Sexual activity: Not on file   Other Topics Concern   . Dietary supplements / vitamins No   . Anesthesia problems No   . Blood thinners No   . Pregnant Not Asked   . Future Children Not Asked   . Number of Pregnancies? Not Asked   . Number of children Not Asked   . Miscarriages / Abortions? Not Asked   . Eats large amounts No   . Excessive Sweets No   . Skips meals No   . Eats excessive starches No   . Snacks or grazes Yes   . Emotional eater Yes   . Eats fried food No   . Eats fast food No   . Diet Center No   . Doylene Bode No   . [place] Weight Loss No   . Nutri-System No   . Opti-Fast / Medi-Fast No   . Overeaters Anonymous No   . [facility] Yes   . TOPS No   . Weight Watchers No   . Atkins No   . Binging / Purging No   . Calorie Counting No   . Fasting No   . High Protein Yes   . Low Carb Yes   . Low Fat Yes   . [facility] Diet No   . Slim Fast No   . [place] No   . Stationary cycle or treadmill No   . Gym/fitness Classes No   . Home exercise/video No   . Swimming No   . Weight training No   . Walking or  running Yes   . Hospitalization No   . Hypnosis No   . Physical therapy No   . Psychological therapy No   . Residential program No   . Acutrim No   . [place]etta No   . Contrave No   . Dexatrim No   . Diethylpropion No   . Fastin No   . Fen - Phen No   . Ionamin / Adipex No   . Phentermine No   . Qsymia No   . Prozac No   . Saxenda No   . Topamax No   . Wellbutrin No   . Xenical (Orlistat,  Alli) No   . Other Med No   . No impairment No   . Walks with cane/crutch No   . Requires a wheelchair No   . Bedridden No   . Are you currently being treated for depression? No   . Do you snore? No   . Are you receiving any medical or psychological services? No   . Do you ever wake up at night gasping for breath? No   . Do you have or have you been treated for an eating disorder? No   . Anyone ever told you that you stop breathing while asleep? No   . Do you exercise regularly? Yes   . Have you or family member ever have trouble with anesthesia? No   Social History Narrative   . Not on file     Social Determinants of Health     Financial Resource Strain:    . Difficulty of Paying Living Expenses: Not on file   Food Insecurity:    . Worried About Programme researcher, broadcasting/film/video in the Last Year: Not on file   . Ran Out of Food in the Last Year: Not on file   Transportation Needs:    . Lack of Transportation (Medical): Not on file   . Lack of Transportation (Non-Medical): Not on file   Physical Activity:    . Days of Exercise per Week: Not on file   . Minutes of Exercise per Session: Not on file   Stress:    . Feeling of Stress : Not on file   Social Connections:    . Frequency of Communication with Friends and Family: Not on file   . Frequency of Social Gatherings with Friends and Family: Not on file   . Attends Religious Services: Not on file   . Active Member of Clubs or Organizations: Not on file   . Attends Banker Meetings: Not on file   . Marital Status: Not on file   Intimate Partner Violence:    . Fear of Current or Ex-Partner: Not on file   . Emotionally Abused: Not on file   . Physically Abused: Not on file   . Sexually Abused: Not on file   Housing Stability:    . Unable to Pay for Housing in the Last Year: Not on file   . Number of Places Lived in the Last Year: Not on file   . Unstable Housing in the Last Year: Not on file       The following sections were reviewed this encounter by the provider:    Tobacco  Allergies  Meds  Problems  Med Hx  Surg Hx  Fam Hx         ROS:  Review of Systems     Review of Systems   General/Constitutional:  Denies Chills.  Denies Fatigue.  Denies Fever.            ENT:           Denies Nasal Discharge.  Denies Sinus pain.  Denies Sore throat.       Endocrine:           Denies Polydipsia.  Denies Polyuria.       Respiratory:           Denies Cough.  Denies Orthopnea.   Denies Wheezing.       Cardiovascular:           Denies Chest pain.  Denies Chest pain with exertion.  Denies Palpitations.  Denies Swelling in hands/feet.       Gastrointestinal:           Denies Abdominal pain.  Denies Constipation.  Denies Diarrhea.  Denies Nausea.  Denies Vomiting.       Hematology:           Denies Easy bruising.  Denies Easy Bleeding.       Genitourinary:           Denies Blood in urine.  Denies Painful urination.       Peripheral Vascular:           Denies Pain/cramping in legs after exertion.  Denies Painful extremities.       Skin:           Denies Rash.       Neurologic:           Denies Dizziness.  Denies Pre-Syncope.  Denies Tingling/Numbness.       Psychiatric:           Denies Anxiety.  Denies Depressed mood.        Vitals:  BP 116/76   Pulse 83   Resp 20   Ht 1.651 m (5\' 5" )   Wt 108.4 kg (239 lb)   BMI 39.77 kg/m      Objective:     Physical Exam:  Physical Exam  Vitals and nursing note reviewed.   Constitutional:       Appearance: Normal appearance.   Eyes:      Pupils: Pupils are equal, round, and reactive to light.   Cardiovascular:      Rate and Rhythm: Normal rate and regular rhythm.      Pulses: Normal pulses.      Heart sounds: Normal heart sounds.   Pulmonary:      Effort: Pulmonary effort is normal.      Breath sounds: Normal breath sounds.   Skin:     General: Skin is warm and dry.      Capillary Refill: Capillary refill takes less than 2 seconds.   Neurological:      General: No focal deficit present.      Mental Status: She is alert.   Psychiatric:          Mood and Affect: Mood normal.         Behavior: Behavior normal.         Thought Content: Thought content normal.         Judgment: Judgment normal.             Assessment:     1. Pre-operative examination  - ECG 12 lead    2. Type 2 diabetes mellitus with diabetic chronic kidney disease, unspecified CKD stage, unspecified whether long term insulin use    3. Hypertension, unspecified type  4. History of pulmonary embolism    5. Obstructive sleep apnea syndrome      Plan:     EKG ordered and reviewed.    EKG Findings:  sinus rhythm, no acute ST-T changes  patient is cleared for surgery.  Pt to continue to take mediations as prescribed.   Pt to fu for regularly scheduled chronic care visits  Pt to fu sooner if needed.     Tyrell Antonio, FNP

## 2020-07-12 ENCOUNTER — Encounter (INDEPENDENT_AMBULATORY_CARE_PROVIDER_SITE_OTHER): Payer: Self-pay | Admitting: Family Medicine

## 2020-08-09 ENCOUNTER — Encounter (INDEPENDENT_AMBULATORY_CARE_PROVIDER_SITE_OTHER): Payer: Self-pay | Admitting: Family Medicine

## 2020-09-06 ENCOUNTER — Encounter (INDEPENDENT_AMBULATORY_CARE_PROVIDER_SITE_OTHER): Payer: Self-pay | Admitting: Family Medicine

## 2020-11-04 ENCOUNTER — Ambulatory Visit (INDEPENDENT_AMBULATORY_CARE_PROVIDER_SITE_OTHER): Payer: Medicare PPO | Admitting: Family Medicine

## 2020-11-04 ENCOUNTER — Encounter (INDEPENDENT_AMBULATORY_CARE_PROVIDER_SITE_OTHER): Payer: Self-pay | Admitting: Family Medicine

## 2020-11-04 ENCOUNTER — Ambulatory Visit: Payer: Self-pay

## 2020-11-04 VITALS — BP 108/80 | HR 55 | Temp 97.2°F | Ht 65.0 in | Wt 248.4 lb

## 2020-11-04 DIAGNOSIS — Z1231 Encounter for screening mammogram for malignant neoplasm of breast: Secondary | ICD-10-CM

## 2020-11-04 DIAGNOSIS — E1122 Type 2 diabetes mellitus with diabetic chronic kidney disease: Secondary | ICD-10-CM

## 2020-11-04 DIAGNOSIS — Z13820 Encounter for screening for osteoporosis: Secondary | ICD-10-CM

## 2020-11-04 DIAGNOSIS — I509 Heart failure, unspecified: Secondary | ICD-10-CM

## 2020-11-04 DIAGNOSIS — Z1159 Encounter for screening for other viral diseases: Secondary | ICD-10-CM

## 2020-11-04 DIAGNOSIS — R635 Abnormal weight gain: Secondary | ICD-10-CM

## 2020-11-04 DIAGNOSIS — I10 Essential (primary) hypertension: Secondary | ICD-10-CM

## 2020-11-04 DIAGNOSIS — Z01419 Encounter for gynecological examination (general) (routine) without abnormal findings: Secondary | ICD-10-CM

## 2020-11-04 DIAGNOSIS — Z124 Encounter for screening for malignant neoplasm of cervix: Secondary | ICD-10-CM

## 2020-11-04 DIAGNOSIS — Z0000 Encounter for general adult medical examination without abnormal findings: Secondary | ICD-10-CM

## 2020-11-04 DIAGNOSIS — Z1211 Encounter for screening for malignant neoplasm of colon: Secondary | ICD-10-CM

## 2020-11-04 LAB — CBC AND DIFFERENTIAL
Absolute NRBC: 0 10*3/uL (ref 0.00–0.00)
Basophils Absolute Automated: 0.04 10*3/uL (ref 0.00–0.08)
Basophils Automated: 0.7 %
Eosinophils Absolute Automated: 0.14 10*3/uL (ref 0.00–0.44)
Eosinophils Automated: 2.5 %
Hematocrit: 42.6 % (ref 34.7–43.7)
Hgb: 13 g/dL (ref 11.4–14.8)
Immature Granulocytes Absolute: 0.01 10*3/uL (ref 0.00–0.07)
Immature Granulocytes: 0.2 %
Lymphocytes Absolute Automated: 2.35 10*3/uL (ref 0.42–3.22)
Lymphocytes Automated: 41.7 %
MCH: 31 pg (ref 25.1–33.5)
MCHC: 30.5 g/dL — ABNORMAL LOW (ref 31.5–35.8)
MCV: 101.7 fL — ABNORMAL HIGH (ref 78.0–96.0)
MPV: 11.7 fL (ref 8.9–12.5)
Monocytes Absolute Automated: 0.46 10*3/uL (ref 0.21–0.85)
Monocytes: 8.2 %
Neutrophils Absolute: 2.64 10*3/uL (ref 1.10–6.33)
Neutrophils: 46.7 %
Nucleated RBC: 0 /100 WBC (ref 0.0–0.0)
Platelets: 317 10*3/uL (ref 142–346)
RBC: 4.19 10*6/uL (ref 3.90–5.10)
RDW: 13 % (ref 11–15)
WBC: 5.64 10*3/uL (ref 3.10–9.50)

## 2020-11-04 LAB — COMPREHENSIVE METABOLIC PANEL
ALT: 10 U/L (ref 0–55)
AST (SGOT): 14 U/L (ref 5–34)
Albumin/Globulin Ratio: 1.2 (ref 0.9–2.2)
Albumin: 4.1 g/dL (ref 3.5–5.0)
Alkaline Phosphatase: 74 U/L (ref 37–117)
Anion Gap: 8 (ref 5.0–15.0)
BUN: 24 mg/dL — ABNORMAL HIGH (ref 7.0–19.0)
Bilirubin, Total: 0.6 mg/dL (ref 0.2–1.2)
CO2: 36 mEq/L — ABNORMAL HIGH (ref 21–29)
Calcium: 9.4 mg/dL (ref 8.5–10.5)
Chloride: 94 mEq/L — ABNORMAL LOW (ref 100–111)
Creatinine: 1 mg/dL (ref 0.4–1.5)
Globulin: 3.5 g/dL (ref 2.0–3.7)
Glucose: 84 mg/dL (ref 70–100)
Potassium: 3.8 mEq/L (ref 3.5–5.1)
Protein, Total: 7.6 g/dL (ref 6.0–8.3)
Sodium: 138 mEq/L (ref 136–145)

## 2020-11-04 LAB — LIPID PANEL
Cholesterol / HDL Ratio: 2.8
Cholesterol: 190 mg/dL (ref 0–199)
HDL: 69 mg/dL (ref 40–9999)
LDL Calculated: 108 mg/dL — ABNORMAL HIGH (ref 0–99)
Triglycerides: 63 mg/dL (ref 34–149)
VLDL Calculated: 13 mg/dL (ref 10–40)

## 2020-11-04 LAB — THYROID STIMULATING HORMONE (TSH), REFLEX ON ABNORMAL TO FREE T4, SERUM: TSH, Abn Reflex to Free T4, Serum: 1.74 u[IU]/mL (ref 0.35–4.94)

## 2020-11-04 LAB — HEMOGLOBIN A1C
Average Estimated Glucose: 93.9 mg/dL
Hemoglobin A1C: 4.9 % (ref 4.6–5.9)

## 2020-11-04 LAB — GFR: EGFR: 55.8

## 2020-11-04 LAB — HEPATITIS C ANTIBODY: Hepatitis C, AB: NONREACTIVE

## 2020-11-04 LAB — HEMOLYSIS INDEX: Hemolysis Index: 0 (ref 0–24)

## 2020-11-04 NOTE — Progress Notes (Signed)
[facility]                       Date of Exam: 11/04/2020 11:57 AM        Patient ID: Caitlin Malone is a 65 y.o. female.  Attending Physician: Tyrell Antonio, FNP       This note was dictated using Dragon Medical One software, by [employer], a voice recognition speech to text software, and may contain inadvertent recognition errors. There may be inherent errors or limitations not intended by the user. Not all errors are caught or corrected. If there are questions or concerns about the content of this note or the information contained within the body of this dictation they should be addressed directly with the author for clarification.    Subjective:   Chief Complaint:Patient is here for a general physical exam  Chief Complaint   Patient presents with   . Annual Exam     Pap: pt doesn't remember date states 2 years ago.  Mammo:October 2021 .  Pt is fasting and requested pap.       HPI:  HPI     1.Wellness Exam:   Patient is a 65 y.o. female who is here for a general physical exam.     Pt has a hx of htn, diabetes type 2. She currently takes olmesartan-hydrochlorothiazide (BENICAR HCT) 40-25 MG per tablet. Pt has a hx of dilated cardiomyopathy with preserved ejection fraction. She Recently was seen by nephrologist due to acute renal insufficiency.  who suggested [place] lasix and continuing to monitor bp and weight .  She has been struggling with weight loss. She states she continues to lose and gain the same 40 pounds.  She requests referral for hormonal testing and endocrinology.    Smoke: no  Drink: no  Diet: well rounded.   Immunization/ Flu: Pt is going to call the pharmacist and ask-she states she is unsure if she did get her shingles vaccine or not.  She plans on getting the fourth COVID vaccination.  Lmp: 2015 post menopausal.   Pap: Pt is unsure, thinks 2020- normal -  Request pap today  Denies any gyn complaints  Mammo: October 2021  Dexa scan : Last DEXA  2010  Colonoscopy: 2013-   Dentist: 2xyear   Fasting: pt is fasting    She reports no known exposure to STD; safe sex practices, no STD lesions/symptoms, and performs self-examination of breasts.    Pt has no complaints of red flag s/s no changes in bowels or bladder habits, fainting spells, chest pain, sob, dizziness, syncope, palpitations.     Active problem list, medications, allergies, past medical history, surgical history, social history and family history were reviewed in detail with patient. All questions answered. Please see ROS for specific concerns.    Problem List:  Patient Active Problem List   Diagnosis   . Type 2 diabetes mellitus without complication   . Obstructive sleep apnea syndrome   . Morbid obesity   . Hypertensive disorder   . Encounter for weight loss counseling   . Edema of lower extremity   . Diabetes mellitus   . Congestive heart failure   . Arthritis of knee   . Arthritis   . Snoring   . History of pulmonary embolism   . Type 2 diabetes mellitus with diabetic chronic kidney disease       Current Medications:  Current Outpatient Medications  Medication Sig Dispense Refill   . Buprenorphine (BUTRANS) 5 MCG/HR Patch Weekly 1 patch     . carvedilol (COREG) 3.125 MG tablet Take 3.125 mg by mouth Twice a day     . Eliquis 5 MG Take 5 mg by mouth daily     . furosemide (LASIX) 40 MG tablet TAKE 1 TABLET BY MOUTH EVERY DAY 30 tablet 0   . latanoprost (XALATAN) 0.005 % ophthalmic solution latanoprost 0.005 % eye drops     . Loratadine (CLARITIN PO) Take by mouth daily     . montelukast (SINGULAIR) 10 MG tablet montelukast 10 mg tablet   TK 1 T PO  HS     . olmesartan (BENICAR) 40 MG tablet Take 40 mg by mouth every morning     . vitamin D, ergocalciferol, (DRISDOL) 50000 UNIT Cap Take 50,000 Units by mouth once a week     . Ferrous Sulfate (IRON PO) Take by mouth     . gabapentin (NEURONTIN) 300 MG capsule TAKE 1 CAPSULE BY MOUTH EVERY MORNING AND 2 CAPSULES BY MOUTH AT BEDTIME     .  hydroxychloroquine (PLAQUENIL) 200 MG tablet Take 200 mg by mouth 2 (two) times daily     . VITAMIN D PO Take by mouth       No current facility-administered medications for this visit.       Allergies:  No Known Allergies    Immunizations:  Immunization History   Administered Date(s) Administered   . COVID-19 mRNA vaccine 12 years and above [employer] Cap) 30 mcg/0.3 mL 10/11/2019, 10/23/2019, 04/01/2020   . INFLUENZA HIGH DOSE 65 YRS+ 07/24/2012   . Influenza (Im) Preservative Free 04/27/2015, 06/01/2016   . Influenza quadrivalent (IM) 6 months & up PRESERVED (Afluria/Fluzone) 04/23/2018, 05/24/2018   . Pneumococcal Conjugate, NOS 05/24/2016        Past Medical History:  Past Medical History:   Diagnosis Date   . Asthma    . Congestive heart failure    . Gastroesophageal reflux disease    . History of Chiari malformation 03/19/2019    [facility] ER, dx after fall   . Hypertension    . Prediabetes    . Weight gain        Past Surgical History:  Past Surgical History:   Procedure Laterality Date   . KNEE SURGERY Right 2012       Family History:  Family History   Problem Relation Age of Onset   . Cancer Mother    . Diabetes Mother    . Arthritis Mother    . Obesity Mother    . Cancer Maternal Grandfather        Social History:  Social History     Socioeconomic History   . Marital status: Married   Tobacco Use   . Smoking status: Never Smoker   . Smokeless tobacco: Never Used   Vaping Use   . Vaping Use: Never used   Substance and Sexual Activity   . Alcohol use: Not Currently   . Drug use: Never   Other Topics Concern   . Dietary supplements / vitamins No   . Anesthesia problems No   . Blood thinners No   . Eats large amounts No   . Excessive Sweets No   . Skips meals No   . Eats excessive starches No   . Snacks or grazes Yes   . Emotional eater Yes   . Eats fried food No   .  Eats fast food No   . Diet Center No   . Doylene Bode No   . [place] Weight Loss No   . Nutri-System No   . Opti-Fast / Medi-Fast No   .  Overeaters Anonymous No   . [facility] Yes   . TOPS No   . Weight Watchers No   . Atkins No   . Binging / Purging No   . Calorie Counting No   . Fasting No   . High Protein Yes   . Low Carb Yes   . Low Fat Yes   . [facility] Diet No   . Slim Fast No   . [place] No   . Stationary cycle or treadmill No   . Gym/fitness Classes No   . Home exercise/video No   . Swimming No   . Weight training No   . Walking or running Yes   . Hospitalization No   . Hypnosis No   . Physical therapy No   . Psychological therapy No   . Residential program No   . Acutrim No   . [place]etta No   . Contrave No   . Dexatrim No   . Diethylpropion No   . Fastin No   . Fen - Phen No   . Ionamin / Adipex No   . Phentermine No   . Qsymia No   . Prozac No   . Saxenda No   . Topamax No   . Wellbutrin No   . Xenical (Orlistat, Alli) No   . Other Med No   . No impairment No   . Walks with cane/crutch No   . Requires a wheelchair No   . Bedridden No   . Are you currently being treated for depression? No   . Do you snore? No   . Are you receiving any medical or psychological services? No   . Do you ever wake up at night gasping for breath? No   . Do you have or have you been treated for an eating disorder? No   . Anyone ever told you that you stop breathing while asleep? No   . Do you exercise regularly? Yes   . Have you or family member ever have trouble with anesthesia? No        The following sections were reviewed this encounter by the provider:   Tobacco  Allergies  Meds  Problems  Med Hx  Surg Hx  Fam Hx           ROS:  Review of Systems   See HPI for ROS    Vitals:  BP 108/80   Pulse (!) 55   Temp 97.2 F (36.2 C)   Ht 1.651 m (5\' 5" )   Wt 112.7 kg (248 lb 6.4 oz)   BMI 41.34 kg/m      Objective:     Physical Exam:  Physical Exam     Constitutional: General Appearance: well developed. Level of Distress: no acute distress.  Eyes: PERRLA. Eyes extraocular movements intact.  Ears, Nose, Throat:  Ears: normal hearing,  tympanic membranes pearly with intact light reflex  Neck: Appearance supple and no lymphadenopathy. Thyroid: no enlargement or nodules.  Lungs / Chest: Respiratory effort: unlabored. Auscultation: no wheezing, rales/crackles, or rhonchi. Percussion: no dullness or hyperresonance  Cardiovascular: Rate And Rhythm: regular. Heart Sounds: no murmurs no rub, no gallops, no clicks. Neck vessels: No carotid bruits. No jugular venous distention  Breast: Breasts symmetric, no masses, no skin changes, no tenderness, no abnormal secretions, normal nipple appearance, no axillary lymphadenopathy  Abdomen: Bowel Sounds: normal pitch and intensity. Inspection and Palpation: soft, non-distended, and no tenderness. No guarding, no rebound tenderness, no masses, no CVA tenderness.  Liver: non-tender, no hepatomegaly  Spleen: non-tender, no splenomegaly  Hernia: none palpable  Female Genitalia  Vulva: no masses, no atrophy, no lesions  Vagina: no tenderness, no erythema, no abnormal vaginal discharge, no vesicle(s) or ulcers, no cystocele, no rectocele  Cervix: grossly normal, no discharge, no cervical motion tenderness  Uterus: normal size, normal shape, midline, no uterine prolapse, mobile, non-tender  Bladder/Urethra: normal meatus, no urethral discharge, no urethral mass, bladder non distended  Adnexa/Parametria: no adnexal tenderness, no ovarian mass  Lymphatics: General Lymphatics: no lymphadenopathy.  Extremities: Extremities: no swelling or inflammation of the extremities. No clubbing. No cyanosis.   Musculoskeletal System: General Musculoskeletal no joint, muscle, or bone tenderness and full range of motion.  Skin: General Skin no rash or suspicious lesions.  Neurologic: Gait and Station: normal gait. Cranial Nerves: grossly intact. Motor: full expected strength and normal tone.  Mental Status Exam: Orientation alert and oriented. Affect: appropriate affect. Mood: appropriate mood. Language and Speech: normal speech.         Assessment:     1. Well adult exam  - CBC and differential  - Comprehensive metabolic panel  - TSH, Abn Reflex to Free T4, Serum  - Lipid panel    2. Hypertension, unspecified type    3. Congestive heart failure, unspecified HF chronicity, unspecified heart failure type    4. Type 2 diabetes mellitus with diabetic chronic kidney disease, unspecified CKD stage, unspecified whether long term insulin use  - Hemoglobin A1C  - Ambulatory referral to Endocrinology    5. Morbid obesity    6. Screening for malignant neoplasm of cervix  - Thin Prep PAP w/Image Analysis and HPV    7. Encounter for screening mammogram for malignant neoplasm of breast  - Mammo Digital Screening Bilateral W Cad    8. Encounter for hepatitis C screening test for low risk patient  - Hepatitis C (HCV) antibody, Total    9. Screening for osteoporosis  - Dxa Bone Density Axial Skeleton    10. Weight gain  - Ambulatory referral to Endocrinology    11. Screening for malignant neoplasm of colon  - Referral to Gastroenterology - EXTERNAL      Plan:     Patient to find out if she received shingles vaccination.  Lab work ordered, pt will get bw- provider will notify of abnormal results   Care and red flag sxs reviewed  Healthy lifestyle discussed with patient  Health maintenance topics discussed  Self breast exam education provided  Mammogram order provided  DEXA scan provided  Referral for colonoscopy provided  Referral for endocrinology provided  Patient to continue medications as prescribed  Pt follow up 6 months, earlier if needed       Tyrell Antonio, FNP

## 2020-11-05 ENCOUNTER — Other Ambulatory Visit: Payer: Self-pay | Admitting: Critical Care Medicine

## 2020-11-05 LAB — THIN PREP PAP W/IMAGE ANALYSIS AND HPV
HPV Genotype, 16: NEGATIVE
HPV Genotype, 18: NEGATIVE
HPV High Risk, Other: NEGATIVE

## 2020-11-05 NOTE — Progress Notes (Signed)
Caitlin Malone,     Your blood work was ok.   Please continue to fu with your nephrologist  Your cholesterol levels were normal  Your A1c was normal  Please continue your medications as prescribed  I am still waiting for your Pap smear results.   I will let you know as soon as I receive and review them.     Let me know if you have any questions.    Thank you,     Felisa Bonier, FNP-C

## 2020-11-08 ENCOUNTER — Other Ambulatory Visit (INDEPENDENT_AMBULATORY_CARE_PROVIDER_SITE_OTHER): Payer: Self-pay

## 2020-11-08 ENCOUNTER — Other Ambulatory Visit (INDEPENDENT_AMBULATORY_CARE_PROVIDER_SITE_OTHER): Payer: Self-pay | Admitting: Family Medicine

## 2020-11-08 DIAGNOSIS — Z86711 Personal history of pulmonary embolism: Secondary | ICD-10-CM

## 2020-11-08 DIAGNOSIS — I10 Essential (primary) hypertension: Secondary | ICD-10-CM

## 2020-11-08 LAB — LAB USE ONLY - HISTORICAL GYN CYTOLOGY/PAP SMEAR

## 2020-11-08 MED ORDER — OLMESARTAN MEDOXOMIL 40 MG PO TABS
40.0000 mg | ORAL_TABLET | Freq: Every morning | ORAL | 1 refills | Status: DC
Start: 2020-11-08 — End: 2021-07-05

## 2020-11-08 MED ORDER — ELIQUIS 5 MG PO TABS
5.0000 mg | ORAL_TABLET | Freq: Every day | ORAL | 1 refills | Status: DC
Start: 2020-11-08 — End: 2022-08-08

## 2020-11-08 NOTE — Telephone Encounter (Signed)
Pt calls for rf on 4 of her meds, she forgot to request them at her visit on 11/04/20

## 2020-11-08 NOTE — Progress Notes (Signed)
Alysah,     Your pap results were negative for intraepithelial lesion or malignancy.   HPV was not detected.     Let me know if you have any questions.    Thank you,     Felisa Bonier, FNP-C

## 2020-11-11 ENCOUNTER — Telehealth (INDEPENDENT_AMBULATORY_CARE_PROVIDER_SITE_OTHER): Payer: Self-pay | Admitting: Family Medicine

## 2020-11-11 NOTE — Telephone Encounter (Signed)
Patient was advised last June that she will need to get her lasix from her cardiologist.   As well, she will need to get her eye drops from her ophthalmologist.     Let me know if you have any questions.    Thank you,     Felisa Bonier, FNP-C

## 2020-11-11 NOTE — Telephone Encounter (Signed)
Patient called states the following two prescriptions were not sent in yesterday with the other two.    latanoprost (XALATAN) 0.005 % ophthalmic solution    furosemide (LASIX) 40 MG tablet    [facility] [place], [address]

## 2020-11-12 NOTE — Telephone Encounter (Signed)
[facility] at [phone]

## 2020-11-15 NOTE — Telephone Encounter (Signed)
Spoke with patient, advised of message.

## 2020-11-17 ENCOUNTER — Encounter (INDEPENDENT_AMBULATORY_CARE_PROVIDER_SITE_OTHER): Payer: Self-pay | Admitting: Family Medicine

## 2020-11-17 ENCOUNTER — Encounter (INDEPENDENT_AMBULATORY_CARE_PROVIDER_SITE_OTHER): Payer: Medicare PPO

## 2020-11-17 ENCOUNTER — Telehealth (INDEPENDENT_AMBULATORY_CARE_PROVIDER_SITE_OTHER): Payer: Self-pay

## 2020-11-17 DIAGNOSIS — Z1231 Encounter for screening mammogram for malignant neoplasm of breast: Secondary | ICD-10-CM

## 2020-11-17 NOTE — Telephone Encounter (Signed)
Ordered, and will fax to [facility].     Let me know if you have any questions.    Thank you,     Felisa Bonier, FNP-C

## 2020-11-17 NOTE — Addendum Note (Signed)
Addended by: Felisa Bonier on: 11/17/2020 02:38 PM     Modules accepted: Orders

## 2020-11-17 NOTE — Telephone Encounter (Signed)
Call from [facility] in [place], patient was in for her mammogram today but told the tech she felt "other changes/concerns in both breasts and a lump in one of her breasts"     They rescheduled her for tomorrow 11/18/2020 at 10am.     They are requesting a new order for a Bilateral Diagnostic Mammogram with PRN Ultrasound if needed.       Phone [phone] (Back Line)   Fax [phone]

## 2020-11-18 ENCOUNTER — Other Ambulatory Visit: Payer: Self-pay | Admitting: Family Medicine

## 2020-11-18 ENCOUNTER — Encounter (INDEPENDENT_AMBULATORY_CARE_PROVIDER_SITE_OTHER): Payer: Medicare PPO

## 2020-11-19 ENCOUNTER — Other Ambulatory Visit (INDEPENDENT_AMBULATORY_CARE_PROVIDER_SITE_OTHER): Payer: Self-pay | Admitting: Family Medicine

## 2020-11-19 ENCOUNTER — Institutional Professional Consult (permissible substitution) (INDEPENDENT_AMBULATORY_CARE_PROVIDER_SITE_OTHER): Payer: Medicare PPO

## 2020-11-19 NOTE — Progress Notes (Signed)
Caitlin Malone,     Your mammogram results indicate there is no mammographic evidence of malignancy.      Let me know if you have any questions.    Thank you,     Felisa Bonier, FNP-C

## 2020-11-22 NOTE — Progress Notes (Signed)
Caitlin Malone,     Your DEXA scan shows osteopenia.  For now I would recommend continued to take your vitamin D supplement as well as 1200 mg elemental calcium daily. Please try to engage in Weight bearing exercises and strength training.  If applicable, keep in mind moderation of intake of alcohol, caffeine and carbonated beverages.   I would also make sure you share your results with your rheumatologist.    Let me know if you have any questions.    Thank you,     Felisa Bonier, FNP-C

## 2020-11-26 ENCOUNTER — Ambulatory Visit (INDEPENDENT_AMBULATORY_CARE_PROVIDER_SITE_OTHER): Payer: Medicare PPO

## 2020-12-10 ENCOUNTER — Ambulatory Visit (INDEPENDENT_AMBULATORY_CARE_PROVIDER_SITE_OTHER): Payer: Medicare PPO

## 2020-12-17 ENCOUNTER — Ambulatory Visit (INDEPENDENT_AMBULATORY_CARE_PROVIDER_SITE_OTHER): Payer: Medicare PPO

## 2020-12-24 ENCOUNTER — Ambulatory Visit (INDEPENDENT_AMBULATORY_CARE_PROVIDER_SITE_OTHER): Payer: Medicare PPO

## 2020-12-31 ENCOUNTER — Ambulatory Visit (INDEPENDENT_AMBULATORY_CARE_PROVIDER_SITE_OTHER): Payer: Medicare PPO

## 2021-01-07 ENCOUNTER — Ambulatory Visit (INDEPENDENT_AMBULATORY_CARE_PROVIDER_SITE_OTHER): Payer: Medicare PPO

## 2021-01-14 ENCOUNTER — Ambulatory Visit (INDEPENDENT_AMBULATORY_CARE_PROVIDER_SITE_OTHER): Payer: Medicare PPO

## 2021-01-21 ENCOUNTER — Ambulatory Visit (INDEPENDENT_AMBULATORY_CARE_PROVIDER_SITE_OTHER): Payer: Medicare PPO

## 2021-01-28 ENCOUNTER — Ambulatory Visit (INDEPENDENT_AMBULATORY_CARE_PROVIDER_SITE_OTHER): Payer: Medicare PPO

## 2021-02-04 ENCOUNTER — Ambulatory Visit (INDEPENDENT_AMBULATORY_CARE_PROVIDER_SITE_OTHER): Payer: Medicare PPO

## 2021-02-07 ENCOUNTER — Encounter (INDEPENDENT_AMBULATORY_CARE_PROVIDER_SITE_OTHER): Payer: Self-pay | Admitting: Family Medicine

## 2021-02-11 ENCOUNTER — Ambulatory Visit (INDEPENDENT_AMBULATORY_CARE_PROVIDER_SITE_OTHER): Payer: Medicare PPO

## 2021-02-18 ENCOUNTER — Ambulatory Visit (INDEPENDENT_AMBULATORY_CARE_PROVIDER_SITE_OTHER): Payer: Medicare PPO

## 2021-02-23 ENCOUNTER — Other Ambulatory Visit (INDEPENDENT_AMBULATORY_CARE_PROVIDER_SITE_OTHER): Payer: Self-pay | Admitting: Family Medicine

## 2021-02-23 DIAGNOSIS — I509 Heart failure, unspecified: Secondary | ICD-10-CM

## 2021-02-25 ENCOUNTER — Ambulatory Visit (INDEPENDENT_AMBULATORY_CARE_PROVIDER_SITE_OTHER): Payer: Medicare PPO

## 2021-03-01 ENCOUNTER — Ambulatory Visit (INDEPENDENT_AMBULATORY_CARE_PROVIDER_SITE_OTHER): Payer: Medicare PPO

## 2021-03-02 ENCOUNTER — Ambulatory Visit (INDEPENDENT_AMBULATORY_CARE_PROVIDER_SITE_OTHER): Payer: Medicare PPO

## 2021-03-04 ENCOUNTER — Ambulatory Visit (INDEPENDENT_AMBULATORY_CARE_PROVIDER_SITE_OTHER): Payer: Medicare PPO

## 2021-03-11 ENCOUNTER — Ambulatory Visit (INDEPENDENT_AMBULATORY_CARE_PROVIDER_SITE_OTHER): Payer: Medicare PPO

## 2021-03-25 ENCOUNTER — Ambulatory Visit (INDEPENDENT_AMBULATORY_CARE_PROVIDER_SITE_OTHER): Payer: Medicare PPO

## 2021-07-04 ENCOUNTER — Other Ambulatory Visit (INDEPENDENT_AMBULATORY_CARE_PROVIDER_SITE_OTHER): Payer: Self-pay | Admitting: Family Medicine

## 2021-07-04 DIAGNOSIS — I10 Essential (primary) hypertension: Secondary | ICD-10-CM

## 2021-08-03 ENCOUNTER — Telehealth (INDEPENDENT_AMBULATORY_CARE_PROVIDER_SITE_OTHER): Payer: Self-pay

## 2021-08-03 DIAGNOSIS — G4733 Obstructive sleep apnea (adult) (pediatric): Secondary | ICD-10-CM

## 2021-08-03 NOTE — Telephone Encounter (Signed)
Patient left a message stating her C-Pap machine broke. She isnt sure if she needs an appointment with Tresa Endo to get an order to a sleep specialist ?    I spoke with patient and she states her last sleep study was 5 years ago.    I advised patient I would check and see if patient needed an appointment or if we could just write her an order to see a sleep specialist and we would call her back.

## 2021-08-04 NOTE — Addendum Note (Signed)
Addended by: Abner Greenspan on: 08/04/2021 04:30 PM     Modules accepted: Orders

## 2021-08-04 NOTE — Telephone Encounter (Signed)
T/c spoke w/ pt she is informed that she will need to f/u w/ Pulmonary  She does see Dr Erenest Rasher  Will place order in chart for cons on CPAP renewal & mail to her home

## 2021-08-04 NOTE — Telephone Encounter (Signed)
Patient does not need appointment.  Please send patient referral to pulmonologist for management of CPAP    Let me know if you have any questions.    Thank you,     Felisa Bonier, FNP-C

## 2021-09-07 ENCOUNTER — Other Ambulatory Visit (INDEPENDENT_AMBULATORY_CARE_PROVIDER_SITE_OTHER): Payer: Self-pay | Admitting: Family Medicine

## 2021-09-07 DIAGNOSIS — I10 Essential (primary) hypertension: Secondary | ICD-10-CM

## 2021-10-03 ENCOUNTER — Other Ambulatory Visit (INDEPENDENT_AMBULATORY_CARE_PROVIDER_SITE_OTHER): Payer: Self-pay | Admitting: Family Medicine

## 2021-10-03 DIAGNOSIS — I509 Heart failure, unspecified: Secondary | ICD-10-CM

## 2022-01-04 ENCOUNTER — Encounter (INDEPENDENT_AMBULATORY_CARE_PROVIDER_SITE_OTHER): Payer: Self-pay | Admitting: Family Medicine

## 2022-02-22 ENCOUNTER — Other Ambulatory Visit (INDEPENDENT_AMBULATORY_CARE_PROVIDER_SITE_OTHER): Payer: Self-pay | Admitting: Family Medicine

## 2022-02-22 DIAGNOSIS — I10 Essential (primary) hypertension: Secondary | ICD-10-CM

## 2022-02-26 ENCOUNTER — Other Ambulatory Visit (INDEPENDENT_AMBULATORY_CARE_PROVIDER_SITE_OTHER): Payer: Self-pay | Admitting: Family Medicine

## 2022-02-26 DIAGNOSIS — Z86711 Personal history of pulmonary embolism: Secondary | ICD-10-CM

## 2022-03-08 ENCOUNTER — Encounter (INDEPENDENT_AMBULATORY_CARE_PROVIDER_SITE_OTHER): Payer: Self-pay | Admitting: Family Medicine

## 2022-03-28 ENCOUNTER — Encounter (INDEPENDENT_AMBULATORY_CARE_PROVIDER_SITE_OTHER): Payer: Medicare PPO | Admitting: Family Medicine

## 2022-04-06 ENCOUNTER — Other Ambulatory Visit: Payer: Self-pay | Admitting: Critical Care Medicine

## 2022-04-17 ENCOUNTER — Ambulatory Visit (INDEPENDENT_AMBULATORY_CARE_PROVIDER_SITE_OTHER): Payer: Medicare PPO | Admitting: Family Medicine

## 2022-04-17 ENCOUNTER — Encounter (INDEPENDENT_AMBULATORY_CARE_PROVIDER_SITE_OTHER): Payer: Self-pay | Admitting: Family Medicine

## 2022-04-17 VITALS — BP 142/96 | HR 67 | Temp 97.6°F | Ht 65.5 in | Wt 270.0 lb

## 2022-04-17 DIAGNOSIS — I509 Heart failure, unspecified: Secondary | ICD-10-CM

## 2022-04-17 DIAGNOSIS — H6121 Impacted cerumen, right ear: Secondary | ICD-10-CM

## 2022-04-17 DIAGNOSIS — Z1211 Encounter for screening for malignant neoplasm of colon: Secondary | ICD-10-CM

## 2022-04-17 DIAGNOSIS — Z0000 Encounter for general adult medical examination without abnormal findings: Secondary | ICD-10-CM

## 2022-04-17 DIAGNOSIS — I130 Hypertensive heart and chronic kidney disease with heart failure and stage 1 through stage 4 chronic kidney disease, or unspecified chronic kidney disease: Secondary | ICD-10-CM

## 2022-04-17 DIAGNOSIS — Z23 Encounter for immunization: Secondary | ICD-10-CM

## 2022-04-17 DIAGNOSIS — R109 Unspecified abdominal pain: Secondary | ICD-10-CM

## 2022-04-17 DIAGNOSIS — I10 Essential (primary) hypertension: Secondary | ICD-10-CM

## 2022-04-17 DIAGNOSIS — N189 Chronic kidney disease, unspecified: Secondary | ICD-10-CM

## 2022-04-17 DIAGNOSIS — Z1239 Encounter for other screening for malignant neoplasm of breast: Secondary | ICD-10-CM

## 2022-04-17 DIAGNOSIS — R0602 Shortness of breath: Secondary | ICD-10-CM

## 2022-04-17 DIAGNOSIS — J3489 Other specified disorders of nose and nasal sinuses: Secondary | ICD-10-CM

## 2022-04-17 DIAGNOSIS — Z1231 Encounter for screening mammogram for malignant neoplasm of breast: Secondary | ICD-10-CM

## 2022-04-17 DIAGNOSIS — E1122 Type 2 diabetes mellitus with diabetic chronic kidney disease: Secondary | ICD-10-CM

## 2022-04-17 LAB — CBC AND DIFFERENTIAL
Absolute NRBC: 0 10*3/uL (ref 0.00–0.00)
Basophils Absolute Automated: 0.05 10*3/uL (ref 0.00–0.08)
Basophils Automated: 1.2 %
Eosinophils Absolute Automated: 0.11 10*3/uL (ref 0.00–0.44)
Eosinophils Automated: 2.7 %
Hematocrit: 40.1 % (ref 34.7–43.7)
Hgb: 12.3 g/dL (ref 11.4–14.8)
Immature Granulocytes Absolute: 0.01 10*3/uL (ref 0.00–0.07)
Immature Granulocytes: 0.2 %
Instrument Absolute Neutrophil Count: 1.37 10*3/uL (ref 1.10–6.33)
Lymphocytes Absolute Automated: 2.12 10*3/uL (ref 0.42–3.22)
Lymphocytes Automated: 52.1 %
MCH: 31.1 pg (ref 25.1–33.5)
MCHC: 30.7 g/dL — ABNORMAL LOW (ref 31.5–35.8)
MCV: 101.5 fL — ABNORMAL HIGH (ref 78.0–96.0)
MPV: 10.9 fL (ref 8.9–12.5)
Monocytes Absolute Automated: 0.41 10*3/uL (ref 0.21–0.85)
Monocytes: 10.1 %
Neutrophils Absolute: 1.37 10*3/uL (ref 1.10–6.33)
Neutrophils: 33.7 %
Nucleated RBC: 0 /100 WBC (ref 0.0–0.0)
Platelets: 330 10*3/uL (ref 142–346)
RBC: 3.95 10*6/uL (ref 3.90–5.10)
RDW: 13 % (ref 11–15)
WBC: 4.07 10*3/uL (ref 3.10–9.50)

## 2022-04-17 LAB — COMPREHENSIVE METABOLIC PANEL
ALT: 8 U/L (ref 0–55)
AST (SGOT): 12 U/L (ref 5–41)
Albumin/Globulin Ratio: 1.2 (ref 0.9–2.2)
Albumin: 3.9 g/dL (ref 3.5–5.0)
Alkaline Phosphatase: 82 U/L (ref 37–117)
Anion Gap: 10 (ref 5.0–15.0)
BUN: 11 mg/dL (ref 7.0–21.0)
Bilirubin, Total: 0.4 mg/dL (ref 0.2–1.2)
CO2: 31 mEq/L — ABNORMAL HIGH (ref 17–29)
Calcium: 9.3 mg/dL (ref 8.5–10.5)
Chloride: 101 mEq/L (ref 99–111)
Creatinine: 0.7 mg/dL (ref 0.4–1.0)
Globulin: 3.3 g/dL (ref 2.0–3.6)
Glucose: 81 mg/dL (ref 70–100)
Potassium: 4.4 mEq/L (ref 3.5–5.3)
Protein, Total: 7.2 g/dL (ref 6.0–8.3)
Sodium: 142 mEq/L (ref 135–145)
eGFR: >60 mL/min/{1.73_m2} (ref >=60)

## 2022-04-17 LAB — HEMOGLOBIN A1C
Average Estimated Glucose: 96.8 mg/dL
Hemoglobin A1C: 5 % (ref 4.6–5.6)

## 2022-04-17 LAB — LIPID PANEL
Cholesterol / HDL Ratio: 2.9 Index
Cholesterol: 195 mg/dL (ref 0–199)
HDL: 67 mg/dL (ref 40–9999)
LDL Calculated: 115 mg/dL — ABNORMAL HIGH (ref 0–99)
Triglycerides: 64 mg/dL (ref 34–149)
VLDL Calculated: 13 mg/dL (ref 10–40)

## 2022-04-17 LAB — MICROALBUMIN, RANDOM URINE
Urine Creatinine, Random: 153.8 mg/dL
Urine Microalbumin, Random: 13 ug/ml (ref 0.0–30.0)
Urine Microalbumin/Creatinine Ratio: 8 ug/mg (ref 0–30)

## 2022-04-17 LAB — HEMOLYSIS INDEX: Hemolysis Index: 4 Index (ref 0–24)

## 2022-04-17 LAB — THYROID STIMULATING HORMONE (TSH), REFLEX ON ABNORMAL TO FREE T4, SERUM: TSH, Abn Reflex to Free T4, Serum: 1.59 u[IU]/mL (ref 0.35–4.94)

## 2022-04-17 NOTE — Progress Notes (Signed)
[facility] [place] [facility]                       Date of Exam: 04/17/2022 10:01 AM        Patient ID: Caitlin Malone is a 66 y.o. female.  Attending Physician: Tyrell Antonio, FNP       This note was dictated using Dragon Medical One software, by [employer], a voice recognition speech to text software, and may contain inadvertent recognition errors. There may be inherent errors or limitations not intended by the user. Not all errors are caught or corrected. If there are questions or concerns about the content of this note or the information contained within the body of this dictation they should be addressed directly with the author for clarification.    Subjective:   Chief Complaint:Patient is here for a general physical exam  Chief Complaint   Patient presents with    Annual Exam     Pt is fasting.   Last pap:  Last mammo:05/24/21 approximate date    Pt c.o tooth infection. Will have root canal.    Abdominal Cramping     X 1 month.    Loss of Consciousness     Fainted twice 12/26/21 and 01/07/22. Went to er in [place].   Pt fainted on 12/26/21 while driving.          HPI:  HPI     1.Wellness Exam:   Patient is a 66 y.o. female who is here for a general physical exam.     Pt has a hx of htn, diabetes type 2, CHF, osteopenia and, PE, and OSA,  ckd.  Patient currently takes  olmesartan-hydrochlorothiazide daily for her blood pressure  Blood pressure has been well controlled medication-however patient states she did not take her hydrochlorothiazide today.  She states her blood pressures have been well controlled.  Patient has followed up with cardiologist and nephrologist in the past.  Patient states she will need new recommendations for cardiologist and nephrologist.  She was following up with Dr. Nedra Hai from [facility].  She has not seen either since last year.    At her physical last year patient requested following up with endocrinologist for hormonal testing.    Patient also is  currently seeing a pulmonologist for SOB.Darcus Austin, MD   She did have a CT scan 04/07/2022 and will be following up with her tomorrow.  She also follows up with her for management of her OSA.    Earlier in June patient was seen at emergency room after syncopal episode.  Patient was coughing while driving and passed out.  This had happened previously and she was seen by neurologist and diagnosed with Chiari malformation.  Patient has not been back to the neurologist since her initial referral  She denies any further episodes of syncope.    Last physical:10/2020  Smoke: No  Drink: No  Diet: Well-rounded  Exercise:   Immunization/ Flu: Due for shingles flu tetanus and pneumonia  Pap: April 2022-normal  Mammo: April 2022-normal  Dexa scan: April 2022- osteopenia.  Colonoscopy: 2013-10-year plan- did have polyps at that time.  Fasting: pt is fasting    Pt admits to concerns for abdominal cramping x1 month  States her symptoms are intermittent however they occur every day  She states they initially started 1 month ago.  She had been to the dentist and it was determined she needs a root canal.  She was taking penicillin however that was discontinued.  She will be following up with a dentist for her surgery.  She states her abdominal symptoms do not radiate.  She denies any blood in her stools.  Denies any nausea vomiting or diarrhea.  While at the dentist she did have imaging of her head which she states showed a mass in her sinuses.         Active problem list, medications, allergies, past medical history, surgical history, social history and family history were reviewed in detail with patient. All questions answered.     Problem List:  Patient Active Problem List   Diagnosis    Type 2 diabetes mellitus without complication    Obstructive sleep apnea syndrome    Morbid obesity    Hypertensive disorder    Encounter for weight loss counseling    Edema of lower extremity    Diabetes mellitus    Congestive heart failure     Arthritis of knee    Arthritis    Snoring    History of pulmonary embolism    Type 2 diabetes mellitus with diabetic chronic kidney disease       Current Medications:  Current Outpatient Medications   Medication Sig Dispense Refill    ACETAMINOPHEN PO Take by mouth      carvedilol (COREG) 3.125 MG tablet Take 1 tablet (3.125 mg) by mouth Twice a day      Eliquis 5 MG Take 1 tablet (5 mg total) by mouth daily 90 tablet 1    furosemide (LASIX) 40 MG tablet TAKE 1 TABLET BY MOUTH EVERY MORNING (Patient taking differently: 0.5 tablets (20 mg) Takes 20mg  daily) 90 tablet 0    latanoprost (XALATAN) 0.005 % ophthalmic solution latanoprost 0.005 % eye drops      Loratadine (CLARITIN PO) Take by mouth daily      montelukast (SINGULAIR) 10 MG tablet montelukast 10 mg tablet   TK 1 T PO  HS      olmesartan (BENICAR) 40 MG tablet TAKE 1 TABLET BY MOUTH EVERY MORNING 90 tablet 2    Buprenorphine (BUTRANS) 5 MCG/HR Patch Weekly 1 patch (Patient not taking: Reported on 04/17/2022)      timolol (TIMOPTIC) 0.5 % ophthalmic solution INSTILL 1 DROP IN BOTH EYES TWICE DAILY      VITAMIN D PO Take by mouth (Patient not taking: Reported on 04/17/2022)      vitamin D, ergocalciferol, (DRISDOL) 50000 UNIT Cap Take 50,000 Units by mouth once a week (Patient not taking: Reported on 04/17/2022)       No current facility-administered medications for this visit.       Allergies:  No Known Allergies    Immunizations:  Immunization History   Administered Date(s) Administered    COVID-19 mRNA MONOVALENT vaccine PRIMARY SERIES 12 years and above [employer]) 30 mcg/0.3 mL (DILUTE BEFORE USE) 10/11/2019, 10/23/2019, 04/01/2020    INFLUENZA HIGH DOSE 65 YRS+ 07/24/2012    INFLUENZA HIGH DOSE 65 YRS+ Quad 0.7 mL 04/17/2022    Influenza (Im) Preservative Free 04/27/2015, 05/25/2015, 06/01/2016    Influenza Whole 06/10/2007    Influenza quadrivalent (IM) 6 months & up PRESERVED (Afluria/Fluzone) 04/23/2018, 05/24/2018    Pneumococcal 23 valent 08/02/2016     Pneumococcal Conjugate 20-Valent 04/17/2022    Pneumococcal Conjugate, NOS 05/24/2016        Past Medical History:  Past Medical History:   Diagnosis Date    Asthma     Congestive heart failure  Gastroesophageal reflux disease     History of Chiari malformation 03/19/2019    [facility] ER, dx after fall    Hypertension     Prediabetes     Weight gain        Past Surgical History:  Past Surgical History:   Procedure Laterality Date    KNEE SURGERY Right 2012       Family History:  Family History   Problem Relation Age of Onset    Cancer Mother     Diabetes Mother     Arthritis Mother     Obesity Mother     Cancer Maternal Grandfather        Social History:  Social History     Socioeconomic History    Marital status: Married   Tobacco Use    Smoking status: Never    Smokeless tobacco: Never   Vaping Use    Vaping Use: Never used   Substance and Sexual Activity    Alcohol use: Not Currently    Drug use: Never   Other Topics Concern    Dietary supplements / vitamins No    Anesthesia problems No    Blood thinners No    Eats large amounts No    Excessive Sweets No    Skips meals No    Eats excessive starches No    Snacks or grazes Yes    Emotional eater Yes    Eats fried food No    Eats fast food No    Diet Center No    Doylene Bode No    LA Weight Loss No    Nutri-System No    Opti-Fast / Medi-Fast No    Overeaters Anonymous No    [facility] Yes    TOPS No    Weight Watchers No    Atkins No    Binging / Purging No    Calorie Counting No    Fasting No    High Protein Yes    Low Carb Yes    Low Fat Yes    [facility] Diet No    Slim Fast No    [place] No    Stationary cycle or treadmill No    Gym/fitness Classes No    Home exercise/video No    Swimming No    Weight training No    Walking or running Yes    Hospitalization No    Hypnosis No    Physical therapy No    Psychological therapy No    Residential program No    Acutrim No    [place] No    Contrave No    Dexatrim No    Diethylpropion No     Fastin No    Fen - Phen No    Ionamin / Adipex No    Phentermine No    Qsymia No    Prozac No    Saxenda No    Topamax No    Wellbutrin No    Xenical (Orlistat, Alli) No    Other Med No    No impairment No    Walks with cane/crutch No    Requires a wheelchair No    Bedridden No    Are you currently being treated for depression? No    Do you snore? No    Are you receiving any medical or psychological services? No    Do you ever wake up at night gasping for breath? No  Do you have or have you been treated for an eating disorder? No    Anyone ever told you that you stop breathing while asleep? No    Do you exercise regularly? Yes    Have you or family member ever have trouble with anesthesia? No        The following sections were reviewed this encounter by the provider:        ROS:  Review of Systems   SEE HPI FOR ROS  Vitals:  BP (!) 142/96   Pulse 67   Temp 97.6 F (36.4 C)   Ht 1.664 m (5' 5.5")   Wt 122.5 kg (270 lb)   SpO2 97%   BMI 44.25 kg/m      Objective:     Physical Exam:  Physical Exam     Vitals reviewed.   Constitutional:       Appearance: Normal appearance.   HENT:      Head: Normocephalic.      Right Ear: Impacted cerumen   Left Ear: Tympanic membrane, ear canal and external ear normal.      Nose: Nose normal.      Mouth/Throat:      Pharynx: Oropharynx is clear.   Eyes:      Conjunctiva/sclera: Conjunctivae normal.      Pupils: Pupils are equal, round, and reactive to light.   Cardiovascular:      Rate and Rhythm: Normal rate and regular rhythm.      Pulses: Normal pulses.      Heart sounds: Normal heart sounds.   Pulmonary:      Effort: Pulmonary effort is normal.      Breath sounds: wheezing   Abdominal:      General: Abdomen is nondistended. bowel sounds are normal.      Palpations: Abdomen is soft.      Tenderness: There is generalized abdominal tenderness  Musculoskeletal:         General: Normal range of motion.   Lymphadenopathy:      Cervical: No cervical adenopathy.   Skin:      General: Skin is warm.   Neurological:      General: No focal deficit present.      Mental Status: He is alert.   Psychiatric:         Mood and Affect: Mood normal.         Behavior: Behavior normal.         Thought Content: Thought content normal.         Judgment: Judgment normal.        Assessment:     1. Well adult exam  - CBC and differential  - Comprehensive metabolic panel  - TSH, Abn Reflex to Free T4, Serum  - Lipid panel    2. Hypertension, unspecified type    3. Congestive heart failure, unspecified HF chronicity, unspecified heart failure type    4. Type 2 diabetes mellitus with diabetic chronic kidney disease, unspecified CKD stage, unspecified whether long term insulin use  - Hemoglobin A1C  - Urine Microalbumin Random    5. Encounter for screening mammogram for malignant neoplasm of breast  - Mammo Digital 2D Screening Bilateral W CAD    6. Screening for malignant neoplasm of colon    7. Encounter for vaccination  - Pneumococcal Conjugate 20 - Valent  - Flu vaccine HIGH-DOSE QUAD (PF) 65 yrs and older    8. Mass of sinus    9. Abdominal  cramping    10. Impacted cerumen of right ear    11. SOB (shortness of breath)      Plan:     1. Wellness Exam:   Lab work ordered, pt will get bw- provider will notify of abnormal results   Care and red flag sxs reviewed  Healthy lifestyle discussed with patient  Health maintenance topics discussed  Pt follow up 1 year, earlier if needed   2. Hypertension, unspecified type/3. Congestive heart failure, unspecified HF chronicity, unspecified heart failure type  Patient to continue medication as prescribed  Patient to continue to monitor blood pressure levels  Patient advised to follow-up with cardiologist  Patient to follow-up if needed  4. Type 2 diabetes mellitus with diabetic chronic kidney disease, unspecified CKD stage, unspecified whether long term insulin use  - Hemoglobin A1C  - Urine Microalbumin Random  Patient to continue medication as prescribed  Patient to  follow-up in 6 months sooner if needed  5. Encounter for screening mammogram for malignant neoplasm of breast  - Mammo Digital 2D Screening Bilateral W CAD    6. Screening for malignant neoplasm of colon  Patient advised to follow-up with GI  List of GI providers given to patient  Patient to follow-up if needed  7. Encounter for vaccination  - Pneumococcal Conjugate 20 - Valent  - Flu vaccine HIGH-DOSE QUAD (PF) 65 yrs and older    8. Mass of sinus  Patient advised to follow-up with ENT for evaluation  List of ENT provided to patient  Patient to follow-up if needed  9. Abdominal cramping  Patient advised probiotics  Monitor symptoms  Hydration  Possible follow-up with GI  Patient to follow-up if symptoms persist or worsen  10. Impacted cerumen of right ear  Patient advised Debrox twice daily for 3 to 5 days  Follow-up in office for right ear cleaning if needed  11. SOB (shortness of breath)  Patient to continue to follow-up with pulmonologist as indicated-patient to follow-up for her visit tomorrow  Patient to follow-up if needed  Tyrell Antonio, FNP

## 2022-04-18 ENCOUNTER — Encounter (INDEPENDENT_AMBULATORY_CARE_PROVIDER_SITE_OTHER): Payer: Self-pay | Admitting: Family Medicine

## 2022-04-18 NOTE — Progress Notes (Signed)
[name],     Your CBC is stable.    Your thyroid levels are normal.    Your cholesterol levels are okay.  I do think based on your history it would be beneficial to begin cholesterol medication.  Please schedule follow-up visit to discuss the medication for you may discuss when you follow-up with a cardiologist as recommended at your appointment.    Normal kidney and liver functions    Your A1c was normal.    Your urine studies are normal.    Let me know if you have any questions.    Thank you,     Felisa Bonier, FNP-C

## 2022-04-24 ENCOUNTER — Other Ambulatory Visit (INDEPENDENT_AMBULATORY_CARE_PROVIDER_SITE_OTHER): Payer: Self-pay | Admitting: Family Medicine

## 2022-04-24 DIAGNOSIS — Z86711 Personal history of pulmonary embolism: Secondary | ICD-10-CM

## 2022-04-28 ENCOUNTER — Encounter (INDEPENDENT_AMBULATORY_CARE_PROVIDER_SITE_OTHER): Payer: Self-pay | Admitting: Family Medicine

## 2022-05-16 HISTORY — PX: TOOTH EXTRACTION: SUR596

## 2022-05-22 ENCOUNTER — Encounter (INDEPENDENT_AMBULATORY_CARE_PROVIDER_SITE_OTHER): Payer: Self-pay | Admitting: Family Medicine

## 2022-06-16 HISTORY — PX: OTHER SURGICAL HISTORY: SHX169

## 2022-06-22 ENCOUNTER — Other Ambulatory Visit (INDEPENDENT_AMBULATORY_CARE_PROVIDER_SITE_OTHER): Payer: Self-pay | Admitting: Family Medicine

## 2022-06-22 DIAGNOSIS — I10 Essential (primary) hypertension: Secondary | ICD-10-CM

## 2022-06-29 ENCOUNTER — Encounter (INDEPENDENT_AMBULATORY_CARE_PROVIDER_SITE_OTHER): Payer: Self-pay | Admitting: Family Medicine

## 2022-07-12 ENCOUNTER — Encounter (INDEPENDENT_AMBULATORY_CARE_PROVIDER_SITE_OTHER): Payer: Self-pay | Admitting: Family Medicine

## 2022-07-23 ENCOUNTER — Encounter (INDEPENDENT_AMBULATORY_CARE_PROVIDER_SITE_OTHER): Payer: Self-pay | Admitting: Family Medicine

## 2022-07-26 ENCOUNTER — Encounter (INDEPENDENT_AMBULATORY_CARE_PROVIDER_SITE_OTHER): Payer: Self-pay | Admitting: Family Medicine

## 2022-07-26 ENCOUNTER — Ambulatory Visit (INDEPENDENT_AMBULATORY_CARE_PROVIDER_SITE_OTHER): Payer: Medicare PPO | Admitting: Family Medicine

## 2022-07-26 VITALS — BP 124/86 | HR 78 | Temp 98.0°F | Ht 65.5 in | Wt 257.0 lb

## 2022-07-26 DIAGNOSIS — I509 Heart failure, unspecified: Secondary | ICD-10-CM

## 2022-07-26 DIAGNOSIS — E1122 Type 2 diabetes mellitus with diabetic chronic kidney disease: Secondary | ICD-10-CM

## 2022-07-26 DIAGNOSIS — N1831 Chronic kidney disease, stage 3a: Secondary | ICD-10-CM

## 2022-07-26 DIAGNOSIS — H401131 Primary open-angle glaucoma, bilateral, mild stage: Secondary | ICD-10-CM

## 2022-07-26 DIAGNOSIS — Z01818 Encounter for other preprocedural examination: Secondary | ICD-10-CM

## 2022-07-26 DIAGNOSIS — I10 Essential (primary) hypertension: Secondary | ICD-10-CM

## 2022-07-26 DIAGNOSIS — H25811 Combined forms of age-related cataract, right eye: Secondary | ICD-10-CM

## 2022-07-26 NOTE — Progress Notes (Signed)
[facility] [place] [facility]                       Date of Exam: 07/26/2022 3:36 PM        Patient ID: Caitlin Malone is a 67 y.o. female.  Attending Physician: Tyrell Antonio, FNP           Subjective:   Chief Complaint:Patient is here for preoperative exam.   Chief Complaint   Patient presents with    Pre-op Exam     Procedure: Cataract Surgeon: Dala Dock MD MBA Dos: 01.25.24 Fax: [phone]       HPI:  HPI   Preoperative exam:  Procedure: Cataract Surgeon: Dala Dock MD MBA Dos: 01.25.24 Fax: [phone]    Patient has a past medical history of htn, diabetes type 2, CHF, osteopenia and, PE, and OSA,  ckd.    Patient did recently follow-up with cardiologist-October 2023.  Had an echo in December.  Patient currently takes  olmesartan 40 mg daily for her blood pressure  Blood pressure has been well controlled medication  She recently has lost and been losing weight through diet and exercise.  She admits she had gained some weight over the holidays however plans to lose it again.  She admits her blood pressure has been getting a little bit lower since her weight loss.  She would have some days where her blood pressure was 99 or 100.  However momentarily her blood pressure is well-controlled on current dose.  She has been wondering about lowering the dose due to recent lower readings  She takes Eliquis daily.  Does follow-up with nephrology for CKD  Her labs have been stable.  Last blood work September 2023.    She has had a history of dyspnea on exertion.  She does follow-up with the pulmonologist for management as well as management of her sleep apnea.  Her dyspnea has been stable.  She states she has been walking without getting short of breath.  She overall has been feeling well.    no history of surgical complications; no history of allergic reaction to anesthetic agents.    Current Symptoms: no unusual fatigue; normal appetite; no fever; no chest pain; no  palpitations; no unexplained syncope; no reduction in exercise tolerance due to chest painno upper respiratory infection; no wheezing; no atypical bleeding; no unusual lower extremity edema/pain; no abdominal pain; normal bowel function; no dysuria/urinary symptoms.     Problem List:  Patient Active Problem List   Diagnosis    Type 2 diabetes mellitus without complication    Obstructive sleep apnea syndrome    Morbid obesity    Hypertensive disorder    Encounter for weight loss counseling    Edema of lower extremity    Diabetes mellitus    Congestive heart failure    Arthritis of knee    Arthritis    Snoring    History of pulmonary embolism    Type 2 diabetes mellitus with stage 3a chronic kidney disease, without long-term current use of insulin       Current Medications:  Current Outpatient Medications   Medication Sig Dispense Refill    ACETAMINOPHEN PO Take by mouth      carvedilol (COREG) 3.125 MG tablet Take 1 tablet (3.125 mg) by mouth Twice a day      Eliquis 5 MG Take 1 tablet (5 mg total) by mouth daily 90 tablet 1  furosemide (LASIX) 40 MG tablet TAKE 1 TABLET BY MOUTH EVERY MORNING (Patient taking differently: 0.5 tablets (20 mg) Takes 20mg  daily) 90 tablet 0    latanoprost (XALATAN) 0.005 % ophthalmic solution latanoprost 0.005 % eye drops      Loratadine (CLARITIN PO) Take by mouth daily      montelukast (SINGULAIR) 10 MG tablet montelukast 10 mg tablet   TK 1 T PO  HS      Multiple Vitamins-Minerals (multivitamin with minerals) tablet Take 1 tablet by mouth daily      olmesartan (BENICAR) 40 MG tablet TAKE 1 TABLET BY MOUTH EVERY MORNING 90 tablet 2    timolol (TIMOPTIC) 0.5 % ophthalmic solution INSTILL 1 DROP IN BOTH EYES TWICE DAILY      VITAMIN D PO Take by mouth      Buprenorphine (BUTRANS) 5 MCG/HR Patch Weekly 1 patch (Patient not taking: Reported on 04/17/2022)      vitamin D, ergocalciferol, (DRISDOL) 50000 UNIT Cap Take 50,000 Units by mouth once a week (Patient not taking: Reported on  04/17/2022)       No current facility-administered medications for this visit.       Allergies:  No Known Allergies    Immunizations:  Immunization History   Administered Date(s) Administered    COVID-19 mRNA BIVALENT vaccine 12 years and above [employer]) 30 mcg/0.3 mL 06/12/2021    COVID-19 mRNA MONOVALENT vaccine PRIMARY SERIES 12 years and above (Moderna) 100 mcg/0.5 mL 10/11/2019, 11/02/2019    COVID-19 mRNA MONOVALENT vaccine PRIMARY SERIES 12 years and above [employer]) 30 mcg/0.3 mL (DILUTE BEFORE USE) 10/11/2019, 10/23/2019, 04/01/2020    COVID-19 mRNA MONOVALENT vaccine PRIMARY SERIES 12 years and above [employer]) 30 mcg/0.3 mL (DO NOT DILUTE) 11/21/2020    INFLUENZA HIGH DOSE 65 YRS+ 07/24/2012    INFLUENZA HIGH DOSE 65 YRS+ Quad 0.7 mL 04/17/2022    Influenza (Im) Preservative Free 04/27/2015, 05/25/2015, 06/01/2016    Influenza Whole 06/10/2007    Influenza vaccine, quadrivalent, 6 months and older (Afluria/Fluzone), multi-dose, 5 mL 04/23/2018, 05/24/2018    Pneumococcal 23 valent 08/02/2016    Pneumococcal Conjugate 20-Valent 04/17/2022    Pneumococcal Conjugate, NOS 05/24/2016        Past Medical History:  Past Medical History:   Diagnosis Date    Asthma     Congestive heart failure     Gastroesophageal reflux disease     History of Chiari malformation 03/19/2019    [facility] ER, dx after fall    Hypertension     Prediabetes     Weight gain        Past Surgical History:  Past Surgical History:   Procedure Laterality Date    KNEE SURGERY Right 2012       Family History:  Family History   Problem Relation Age of Onset    Cancer Mother     Diabetes Mother     Arthritis Mother     Obesity Mother     Cancer Maternal Grandfather        Social History:  Social History     Socioeconomic History    Marital status: Married   Tobacco Use    Smoking status: Never    Smokeless tobacco: Never   Vaping Use    Vaping Use: Never used   Substance and Sexual Activity    Alcohol use: Not Currently    Drug use: Never    Other Topics Concern    Dietary supplements / vitamins No  Anesthesia problems No    Blood thinners No    Eats large amounts No    Excessive Sweets No    Skips meals No    Eats excessive starches No    Snacks or grazes Yes    Emotional eater Yes    Eats fried food No    Eats fast food No    Diet Center No    [place] Patience No    LA Weight Loss No    Nutri-System No    Opti-Fast / Medi-Fast No    Overeaters Anonymous No    [facility] Yes    TOPS No    Weight Watchers No    Atkins No    Binging / Purging No    Calorie Counting No    Fasting No    High Protein Yes    Low Carb Yes    Low Fat Yes    [facility] Diet No    Slim Fast No    [place] No    Stationary cycle or treadmill No    Gym/fitness Classes No    Home exercise/video No    Swimming No    Weight training No    Walking or running Yes    Hospitalization No    Hypnosis No    Physical therapy No    Psychological therapy No    Residential program No    Acutrim No    [place] No    Contrave No    Dexatrim No    Diethylpropion No    Fastin No    Fen - Phen No    Ionamin / Adipex No    Phentermine No    Qsymia No    Prozac No    Saxenda No    Topamax No    Wellbutrin No    Xenical (Orlistat, Alli) No    Other Med No    No impairment No    Walks with cane/crutch No    Requires a wheelchair No    Bedridden No    Are you currently being treated for depression? No    Do you snore? No    Are you receiving any medical or psychological services? No    Do you ever wake up at night gasping for breath? No    Do you have or have you been treated for an eating disorder? No    Anyone ever told you that you stop breathing while asleep? No    Do you exercise regularly? Yes    Have you or family member ever have trouble with anesthesia? No        The following sections were reviewed this encounter by the provider:        ROS:  Review of Systems     See HPI for ROS  Vitals:  BP 124/86   Pulse 78   Temp 98 F (36.7 C)   Ht 1.664 m (5' 5.5")   Wt 116.6 kg  (257 lb)   SpO2 98%   BMI 42.12 kg/m      Objective:     Physical Exam:  Physical Exam  Constitutional:       Appearance: Normal appearance. She is obese.   HENT:      Head: Normocephalic.      Nose: Nose normal.   Eyes:      Conjunctiva/sclera: Conjunctivae normal.   Cardiovascular:      Rate and Rhythm: Normal rate  and regular rhythm.   Pulmonary:      Effort: Pulmonary effort is normal.      Breath sounds: Normal breath sounds.   Musculoskeletal:      Cervical back: Neck supple.   Skin:     General: Skin is warm.   Psychiatric:         Mood and Affect: Mood normal.         Behavior: Behavior normal.         Thought Content: Thought content normal.         Judgment: Judgment normal.             Assessment:     1. Preoperative examination    2. Combined forms of age-related cataract of right eye    3. Primary open angle glaucoma of both eyes, mild stage    4. Hypertension, unspecified type    5. Congestive heart failure, unspecified HF chronicity, unspecified heart failure type    6. Type 2 diabetes mellitus with stage 3a chronic kidney disease, without long-term current use of insulin      Plan:     Most recent EKG from October to be sent over from cardiologist and reviewed.  Patient's most recent labs stable  Patient considered acceptable risk for planned procedure  Preoperative Status:  Exercise tolerance is greater than 4 mets.  Hypertension is controlled on the current regimen.  Patient to continue to monitor blood pressures at home.  Discussed possible decreased dose of medication.  Patient will be scheduling a follow-up visit after her surgery for chronic care.  Patient to follow-up sooner if needed      Tyrell Antonio, FNP

## 2022-07-28 ENCOUNTER — Encounter (INDEPENDENT_AMBULATORY_CARE_PROVIDER_SITE_OTHER): Payer: Self-pay | Admitting: Family Medicine

## 2022-08-07 ENCOUNTER — Other Ambulatory Visit (INDEPENDENT_AMBULATORY_CARE_PROVIDER_SITE_OTHER): Payer: Self-pay | Admitting: Family Medicine

## 2022-08-07 DIAGNOSIS — Z86711 Personal history of pulmonary embolism: Secondary | ICD-10-CM

## 2022-08-08 NOTE — Telephone Encounter (Signed)
LVM to contact the office to schedule to review medication refill request

## 2022-08-09 ENCOUNTER — Other Ambulatory Visit (INDEPENDENT_AMBULATORY_CARE_PROVIDER_SITE_OTHER): Payer: Self-pay

## 2022-08-09 DIAGNOSIS — Z86711 Personal history of pulmonary embolism: Secondary | ICD-10-CM

## 2022-08-09 MED ORDER — APIXABAN 5 MG PO TABS
5.0000 mg | ORAL_TABLET | Freq: Two times a day (BID) | ORAL | 2 refills | Status: DC
Start: 2022-08-09 — End: 2023-01-17

## 2022-08-09 NOTE — Telephone Encounter (Signed)
Caitlin Malone from [facility] called - Please resend eliquis medication with new direction/correct direction. Medication instruction should read  twice a day not once a day.      Please review

## 2022-08-10 ENCOUNTER — Other Ambulatory Visit: Payer: Self-pay

## 2022-08-10 DIAGNOSIS — Z1211 Encounter for screening for malignant neoplasm of colon: Secondary | ICD-10-CM

## 2022-08-10 DIAGNOSIS — Z86010 Personal history of colonic polyps: Secondary | ICD-10-CM | POA: Insufficient documentation

## 2022-08-17 HISTORY — PX: CATARACT EXTRACTION: SUR2

## 2022-08-21 ENCOUNTER — Encounter (INDEPENDENT_AMBULATORY_CARE_PROVIDER_SITE_OTHER): Payer: Self-pay | Admitting: Family Medicine

## 2022-08-22 ENCOUNTER — Encounter (INDEPENDENT_AMBULATORY_CARE_PROVIDER_SITE_OTHER): Payer: Self-pay

## 2022-08-23 ENCOUNTER — Ambulatory Visit (INDEPENDENT_AMBULATORY_CARE_PROVIDER_SITE_OTHER): Payer: Medicare PPO | Admitting: Family Medicine

## 2022-08-23 ENCOUNTER — Encounter (INDEPENDENT_AMBULATORY_CARE_PROVIDER_SITE_OTHER): Payer: Self-pay | Admitting: Family Medicine

## 2022-08-23 VITALS — BP 124/80 | HR 55 | Temp 96.9°F | Resp 18 | Wt 251.2 lb

## 2022-08-23 DIAGNOSIS — I10 Essential (primary) hypertension: Secondary | ICD-10-CM

## 2022-08-23 DIAGNOSIS — M1710 Unilateral primary osteoarthritis, unspecified knee: Secondary | ICD-10-CM

## 2022-08-23 DIAGNOSIS — E559 Vitamin D deficiency, unspecified: Secondary | ICD-10-CM

## 2022-08-23 DIAGNOSIS — M8580 Other specified disorders of bone density and structure, unspecified site: Secondary | ICD-10-CM | POA: Insufficient documentation

## 2022-08-23 DIAGNOSIS — R600 Localized edema: Secondary | ICD-10-CM

## 2022-08-23 DIAGNOSIS — I509 Heart failure, unspecified: Secondary | ICD-10-CM

## 2022-08-23 LAB — COMPREHENSIVE METABOLIC PANEL
ALT: 8 U/L (ref 0–55)
AST (SGOT): 10 U/L (ref 5–41)
Albumin/Globulin Ratio: 1.2 (ref 0.9–2.2)
Albumin: 3.6 g/dL (ref 3.5–5.0)
Alkaline Phosphatase: 62 U/L (ref 37–117)
Anion Gap: 3 — ABNORMAL LOW (ref 5.0–15.0)
BUN: 13 mg/dL (ref 7.0–21.0)
Bilirubin, Total: 0.4 mg/dL (ref 0.2–1.2)
CO2: 30 mEq/L — ABNORMAL HIGH (ref 17–29)
Calcium: 8.7 mg/dL (ref 8.5–10.5)
Chloride: 108 mEq/L (ref 99–111)
Creatinine: 0.7 mg/dL (ref 0.4–1.0)
Globulin: 2.9 g/dL (ref 2.0–3.6)
Glucose: 77 mg/dL (ref 70–100)
Potassium: 4.4 mEq/L (ref 3.5–5.3)
Protein, Total: 6.5 g/dL (ref 6.0–8.3)
Sodium: 141 mEq/L (ref 135–145)
eGFR: >60 mL/min/{1.73_m2} (ref >=60)

## 2022-08-23 LAB — THYROID STIMULATING HORMONE (TSH), REFLEX ON ABNORMAL TO FREE T4, SERUM: TSH, Abn Reflex to Free T4, Serum: 1.22 u[IU]/mL (ref 0.35–4.94)

## 2022-08-23 LAB — HEMOLYSIS INDEX(SOFT): Hemolysis Index: 2 Index (ref 0–24)

## 2022-08-23 LAB — VITAMIN D,25 OH,TOTAL: Vitamin D, 25 OH, Total: 47 ng/mL (ref 30–100)

## 2022-08-23 NOTE — Progress Notes (Signed)
[facility]                       Date of Exam: 08/23/2022 10:44 AM        Patient ID: Caitlin Malone is a 67 y.o. female.  Attending Physician: Yetta Numbers, FNP           This note was dictated using Dragon Medical One software, by [employer], a voice recognition speech to text software, and may contain inadvertent recognition errors. There may be inherent errors or limitations not intended by the user. Not all errors are caught or corrected. If there are questions or concerns about the content of this note or the information contained within the body of this dictation they should be addressed directly with the author for clarification.    Subjective:   Chief Complaint:Patient is a 67 y.o. female seen in the office today for   Chief Complaint   Patient presents with    Labs Only     Patient would like to get her hormone levels checked  Kidney function test,  inflammation sensitivity, and tsh      Follow-up     Patient got her tooth extracted and implants and during the procedure the dentisited said her bones were very soft and recommend her get testing done. Dxa scan     Referral To Another Service     ENT       HPI:  HPI     Patient is a 67 y.o. female seen in the office today for requesting follow up. Present with her daughter.   Patient has a past medical history of htn, diabetes type 2, CHF, osteopenia and, PE, and OSA,  and CKD.    Htn/ CHF  [facility]- [facility]ologist  Pt  currently taking olmesartan 40mg , carvedilol 3.125mg  bid and  Furosamide 20mg  2 x day  Previously taking amlodipine as well but that has been [place]  Admits has noticed bw readings in the low 100's. Admits to occasionally lightheadedness  She recently has lost and been losing weight through diet and exercise. Has lost about 20llbs since last September.   Admits occasionally lle when she doesn't take her lasix for a few days.     She recently had dental surgery and  implants  She stases her dentist told her she has soft brittle bones. Admits the implant has been left in longer due to the soft bones  Requesting dexa  Pt last dexa was in 10/2020- dx osteopenia , lowest t score -1.2  Currently taking otc vit d    Oa  Hx of arthritis in her knees  Reusing pt aqua therapy to help with her arthritis     Pt not fasting today  Hx of slightly elevated ld  Lab Results   Component Value Date    CHOL 195 04/17/2022    CHOL 190 11/04/2020     Lab Results   Component Value Date    HDL 67 04/17/2022    HDL 69 11/04/2020     Lab Results   Component Value Date    LDL 115 (H) 04/17/2022    LDL 108 (H) 11/04/2020     Lab Results   Component Value Date    TRIG 64 04/17/2022    TRIG 63 11/04/2020        Ckd  Has been stable  Last egfr and creatinine wnl  Problem List:  Patient Active Problem List   Diagnosis    Type 2 diabetes mellitus without complication    Obstructive sleep apnea syndrome    Morbid obesity    Hypertensive disorder    Encounter for weight loss counseling    Edema of lower extremity    Diabetes mellitus    Congestive heart failure    Arthritis of knee    Rheumatoid arthritis, involving unspecified site, unspecified whether rheumatoid factor present    Snoring    History of pulmonary embolism    Type 2 diabetes mellitus with stage 3a chronic kidney disease, without long-term current use of insulin    History of colon polyps    Osteopenia, unspecified location       Current Medications:  Current Outpatient Medications   Medication Sig Dispense Refill    ACETAMINOPHEN PO Take by mouth      apixaban (Eliquis) 5 MG Take 1 tablet (5 mg) by mouth every 12 (twelve) hours 60 tablet 2    carvedilol (COREG) 3.125 MG tablet Take 1 tablet (3.125 mg) by mouth Twice a day      furosemide (LASIX) 40 MG tablet TAKE 1 TABLET BY MOUTH EVERY MORNING (Patient taking differently: 0.5 tablets (20 mg) Takes 20mg  daily) 90 tablet 0    latanoprost (XALATAN) 0.005 % ophthalmic solution latanoprost  0.005 % eye drops      Loratadine (CLARITIN PO) Take by mouth daily      montelukast (SINGULAIR) 10 MG tablet montelukast 10 mg tablet   TK 1 T PO  HS      Multiple Vitamins-Minerals (multivitamin with minerals) tablet Take 1 tablet by mouth daily      olmesartan (BENICAR) 40 MG tablet TAKE 1 TABLET BY MOUTH EVERY MORNING 90 tablet 2    timolol (TIMOPTIC) 0.5 % ophthalmic solution INSTILL 1 DROP IN BOTH EYES TWICE DAILY      VITAMIN D PO Take by mouth      Buprenorphine (BUTRANS) 5 MCG/HR Patch Weekly 1 patch (Patient not taking: Reported on 04/17/2022)      vitamin D, ergocalciferol, (DRISDOL) 50000 UNIT Cap Take 50,000 Units by mouth once a week (Patient not taking: Reported on 04/17/2022)       No current facility-administered medications for this visit.       Allergies:  No Known Allergies    Immunizations:  Immunization History   Administered Date(s) Administered    COVID-19 mRNA BIVALENT vaccine 12 years and above [employer]) 30 mcg/0.3 mL 06/12/2021    COVID-19 mRNA MONOVALENT vaccine PRIMARY SERIES 12 years and above (Moderna) 100 mcg/0.5 mL 10/11/2019, 11/02/2019    COVID-19 mRNA MONOVALENT vaccine PRIMARY SERIES 12 years and above [employer]) 30 mcg/0.3 mL (DILUTE BEFORE USE) 10/11/2019, 10/23/2019, 04/01/2020    COVID-19 mRNA MONOVALENT vaccine PRIMARY SERIES 12 years and above [employer]) 30 mcg/0.3 mL (DO NOT DILUTE) 11/21/2020    INFLUENZA HIGH DOSE 65 YRS+ 07/24/2012    INFLUENZA HIGH DOSE 65 YRS+ Quad 0.7 mL 04/17/2022    Influenza (Im) Preservative Free 04/27/2015, 05/25/2015, 06/01/2016    Influenza Whole 06/10/2007    Influenza vaccine, QUADRIVALENT, 6 months and older (AFLURIA/FLUARIX/FLULAVAL/FLUZONE), single-dose preservative free, 0.5 mL 06/12/2021    Influenza vaccine, quadrivalent, 6 months and older (Afluria/Fluzone), multi-dose, 5 mL 04/23/2018, 05/24/2018    Pneumococcal 23 valent 08/02/2016    Pneumococcal Conjugate 20-Valent 04/17/2022    Pneumococcal Conjugate, NOS 05/24/2016    Tdap 08/01/2021         Past  Medical History:  Past Medical History:   Diagnosis Date    Asthma     Congestive heart failure     Gastroesophageal reflux disease     History of Chiari malformation 03/19/2019    [facility] ER, dx after fall    Hypertension     Prediabetes     Weight gain        Past Surgical History:  Past Surgical History:   Procedure Laterality Date    CATARACT EXTRACTION Right 08/17/2022    glacoma removal as well    KNEE SURGERY Right 2012    TOOTH EXTRACTION  05/16/2022    upper       Family History:  Family History   Problem Relation Age of Onset    Cancer Mother     Diabetes Mother     Arthritis Mother     Obesity Mother     Cancer Maternal Grandfather        Social History:  Social History     Socioeconomic History    Marital status: Married   Tobacco Use    Smoking status: Never    Smokeless tobacco: Never   Vaping Use    Vaping Use: Never used   Substance and Sexual Activity    Alcohol use: Not Currently    Drug use: Never   Other Topics Concern    Dietary supplements / vitamins No    Anesthesia problems No    Blood thinners No    Eats large amounts No    Excessive Sweets No    Skips meals No    Eats excessive starches No    Snacks or grazes Yes    Emotional eater Yes    Eats fried food No    Eats fast food No    Diet Center No    [facility] Patience No    [facility] Weight Loss No    Nutri-System No    Opti-Fast / Medi-Fast No    Overeaters Anonymous No    [facility] Yes    TOPS No    Weight Watchers No    Atkins No    Binging / Purging No    Calorie Counting No    Fasting No    High Protein Yes    Low Carb Yes    Low Fat Yes    [facility] Diet No    Slim Fast No    [place] No    Stationary cycle or treadmill No    Gym/fitness Classes No    Home exercise/video No    Swimming No    Weight training No    Walking or running Yes    Hospitalization No    Hypnosis No    Physical therapy No    Psychological therapy No    Residential program No    Acutrim No    [place] No    Contrave No    Dexatrim No     Diethylpropion No    Fastin No    Fen - Phen No    Ionamin / Adipex No    Phentermine No    Qsymia No    Prozac No    Saxenda No    Topamax No    Wellbutrin No    Xenical (Orlistat, Alli) No    Other Med No    No impairment No    Walks with cane/crutch No    Requires a wheelchair No  Bedridden No    Are you currently being treated for depression? No    Do you snore? No    Are you receiving any medical or psychological services? No    Do you ever wake up at night gasping for breath? No    Do you have or have you been treated for an eating disorder? No    Anyone ever told you that you stop breathing while asleep? No    Do you exercise regularly? Yes    Have you or family member ever have trouble with anesthesia? No        The following sections were reviewed this encounter by the provider:        ROS:  Review of Systems   Review of systems as noted in the HPI.    Vitals:  BP 124/80   Pulse (!) 55   Temp (!) 96.9 F (36.1 C)   Resp 18   Wt 114 kg (251 lb 4 oz)   SpO2 97%   BMI 41.17 kg/m      Objective:     Physical Exam:  Physical Exam   Constitutional:      No apparent distress   Cardiovascular:      Rate and Rhythm: Normal rate   Pulmonary:      Effort: Pulmonary effort is normal.   Extremities: No edema  Psychiatric:         Mood and Affect: Mood normal.         Behavior: Behavior normal.         Thought Content: Thought content normal.         Judgment: Judgment normal.        Assessment:     1. Hypertension, unspecified type  - Comprehensive metabolic panel  - TSH, Abn Reflex to Free T4, Serum    2. Edema of lower extremity    3. Congestive heart failure, unspecified HF chronicity, unspecified heart failure type    4. Arthritis of knee  - Referral to Physical Therapy - EXTERNAL; Future    5. Osteopenia, unspecified location  - Dxa Bone Density Axial Skeleton; Future    6. Vitamin D deficiency  - Dxa Bone Density Axial Skeleton; Future  - Vitamin D,25 OH, Total      Plan:     1. Hypertension,  unspecified type/2. Edema of lower extremity  3. Congestive heart failure, unspecified HF chronicity, unspecified heart failure type  - Comprehensive metabolic panel  - TSH, Abn Reflex to Free T4, Serum  Discussed decreasing blood pressure medication due to lower readings and lightheadedness.  Patient to decrease Lasix to 20 mg once daily.  Patient advised to monitor blood pressures.  Patient advised to monitor fluid retention and lower leg swelling.  Continue with regular exercise and healthy diet  Continue to follow with cardiology as indicated  Patient to follow-up in 2 weeks, sooner if needed    4. Arthritis of knee  - Referral to Physical Therapy - EXTERNAL; Future  Referral provided  Patient to follow-up if needed  5. Osteopenia, unspecified location/6. Vitamin D deficiency  - Dxa Bone Density Axial Skeleton; Future  Advise 12 to 1500 mg calcium daily.  Patient advised 1000 to 2000 IUs vitamin D daily.  Patient to get updated DEXA scan April 2024  Follow-up and recommendations based on results      Yetta Numbers, FNP

## 2022-08-24 ENCOUNTER — Ambulatory Visit (INDEPENDENT_AMBULATORY_CARE_PROVIDER_SITE_OTHER): Payer: Medicare PPO

## 2022-08-24 ENCOUNTER — Encounter (INDEPENDENT_AMBULATORY_CARE_PROVIDER_SITE_OTHER): Payer: Self-pay

## 2022-08-24 NOTE — Progress Notes (Signed)
Tayelor,     Your CMP is normal.  Normal kidney and liver functions.  Normal electrolytes.  Your thyroid levels are normal.  Vitamin D levels are normal.  For now I would recommend continuing 1 to 2000 IUs of vitamin D a day along with calcium as discussed at your visit yesterday.    Let me know if you have any questions.    Thank you,     Cherlyn Roberts, FNP-C

## 2022-08-24 NOTE — PSS Phone Screening (Signed)
Low risk procedure. No pre-ops. Requested last pulmonary note and cardiology note.    Pre-Anesthesia Evaluation    Pre-op phone visit requested by:   Reason for pre-op phone visit: Patient anticipating COLONOSCOPY procedure.         No orders of the defined types were placed in this encounter.      History of Present Illness/Summary:        Problem List:  Medical Problems       Hospital Problem List  Date Reviewed: 08/23/2022   None        Non-Hospital Problem List  Date Reviewed: 08/23/2022            ICD-10-CM Priority Class Noted    Type 2 diabetes mellitus without complication J09.3   2/67/1245    Obstructive sleep apnea syndrome G47.33   02/19/2019    Morbid obesity E66.01   02/19/2019    Hypertensive disorder I10   02/13/2019    Encounter for weight loss counseling Z71.3   02/20/2019    Edema of lower extremity R60.0   02/19/2019    Diabetes mellitus E11.9   02/13/2019    Congestive heart failure I50.9   02/13/2019    Arthritis of knee M17.10   02/19/2019    Rheumatoid arthritis, involving unspecified site, unspecified whether rheumatoid factor present M06.9   02/13/2019    Snoring R06.83   02/19/2019    History of pulmonary embolism Z86.711   02/19/2019    Type 2 diabetes mellitus with stage 3a chronic kidney disease, without long-term current use of insulin E11.22, N18.31   10/24/2019    History of colon polyps Z86.010   08/10/2022    Osteopenia, unspecified location M85.80   08/23/2022        Medical History   Diagnosis Date    Arthritis     Asthma     Bilateral cataracts     Colon polyps 2012    Congestive heart failure     controlled with meds    COVID-19 vaccine administered     Gastroesophageal reflux disease     Glaucoma     History of Chiari malformation 03/19/2019    [facility] ER, dx after fall    Hypertension     controlled on meds    Morbid obesity with BMI of 40.0-44.9, adult     Prediabetes     last A1c 5.0 04/17/22    Pulmonary embolism 2016    on Eliquis    Sleep apnea     OSA with CPAP     Past Surgical  History:   Procedure Laterality Date    CATARACT EXTRACTION Right 08/17/2022    glacoma removal as well    dental implants  06/16/2022    KNEE SURGERY Right 2012    TOOTH EXTRACTION  05/16/2022    upper        Medication List            Accurate as of August 24, 2022  9:39 AM. Always use your most recent med list.                ACETAMINOPHEN PO  Take 650 mg by mouth every 6 (six) hours as needed  Medication Adjustments for Surgery: Take as needed     albuterol sulfate HFA 108 (90 Base) MCG/ACT inhaler  Inhale 2 puffs into the lungs every 4 (four) hours as needed for Wheezing  Commonly known as: PROVENTIL  Medication Adjustments for Surgery: Take  as needed     apixaban 5 MG  Take 1 tablet (5 mg) by mouth every 12 (twelve) hours  Commonly known as: Eliquis  Medication Adjustments for Surgery: Stop 2 days before surgery  Notes to patient: Follow MD instructions for stopping eliquis     Calcium 200 MG Tabs  Take 1 tablet (200 mg) by mouth every morning  Medication Adjustments for Surgery: Hold day of surgery     carvedilol 3.125 MG tablet  Take 1 tablet (3.125 mg) by mouth Twice a day  Commonly known as: COREG  Medication Adjustments for Surgery: Take morning of surgery     CLARITIN PO  Take 10 mg by mouth every morning  Medication Adjustments for Surgery: Take as needed     furosemide 40 MG tablet  TAKE 1 TABLET BY MOUTH EVERY MORNING  Commonly known as: LASIX  Medication Adjustments for Surgery: Hold day of surgery     latanoprost 0.005 % ophthalmic solution  Place 1 drop into both eyes nightly  Commonly known as: XALATAN  Medication Adjustments for Surgery: Take morning of surgery     montelukast 10 MG tablet  Take 1 tablet (10 mg) by mouth nightly  Commonly known as: SINGULAIR  Medication Adjustments for Surgery: Take as prescribed     multivitamin with minerals tablet  Take 1 tablet by mouth every morning  Medication Adjustments for Surgery: Stop 7 days before surgery     olmesartan 40 MG tablet  TAKE 1 TABLET BY  MOUTH EVERY MORNING  Commonly known as: BENICAR  Medication Adjustments for Surgery: Hold day of surgery     predniSONE 10 MG tablet  Take 1 tablet (10 mg) by mouth 3 (three) times daily  Commonly known as: DELTASONE  Medication Adjustments for Surgery: Take morning of surgery     timolol 0.5 % ophthalmic solution  Place 1 drop into both eyes every morning  Commonly known as: TIMOPTIC  Medication Adjustments for Surgery: Take morning of surgery     VITAMIN D PO  Take 10,000 Units by mouth every morning  Medication Adjustments for Surgery: Hold day of surgery            No Known Allergies  Family History   Problem Relation Age of Onset    Cancer Mother     Diabetes Mother     Arthritis Mother     Obesity Mother     Cancer Maternal Grandfather      Social History     Occupational History    Not on file   Tobacco Use    Smoking status: Never    Smokeless tobacco: Never   Vaping Use    Vaping Use: Never used   Substance and Sexual Activity    Alcohol use: Not Currently    Drug use: Never    Sexual activity: Not on file       Menstrual History:   LMP / Status  Postmenopausal     No LMP recorded. Patient is postmenopausal.    Tubal Ligation?  No valid surgical or medical questions entered.           Exam Scores:   SDB score  Risk Category: OSA Diagnosed With PAP    STBUR score       PONV score  Nausea Risk: MODERATE RISK    MST score  MST Score: 0    PEN-FAST score       Frailty score  CFS Score: 3  CHADsVasc            Visit Vitals  Ht 1.664 m (5' 5.5")   Wt 113.9 kg (251 lb)   BMI 41.13 kg/m       Recent Labs   CBC (last 180 days) 04/17/22  0941   WBC 4.07   RBC 3.95   Hgb 12.3   Hematocrit 40.1   MCV 101.5*   MCH 31.1   MCHC 30.7*   RDW 13   Platelets 330   MPV 10.9   Nucleated RBC 0.0   Absolute NRBC 0.00     Recent Labs   BMP (last 180 days) 04/17/22  0941 08/23/22  1025   Glucose 81 77   BUN 11.0 13.0   Creatinine 0.7 0.7   Sodium 142 141   Potassium 4.4 4.4   Chloride 101 108   CO2 31* 30*   Calcium 9.3 8.7    Anion Gap 10.0 3.0*   eGFR >60.0 >60.0         Recent Labs   Other (last 180 days) 04/17/22  0941 08/23/22  1025   TSH, Abn Reflex to Free T4, Serum 1.59 1.22   Bilirubin, Total 0.4 0.4   ALT 8 8   AST (SGOT) 12 10   Protein, Total 7.2 6.5   Hemoglobin A1C 5.0  --

## 2022-08-25 NOTE — PSS Phone Screening (Signed)
LOV-cardiac media 08/24/2022  Echo-media 08/24/2022.  LOV-pulm media 08/25/2022  CT-media 08/25/2022.

## 2022-08-28 ENCOUNTER — Encounter (INDEPENDENT_AMBULATORY_CARE_PROVIDER_SITE_OTHER): Payer: Self-pay | Admitting: Family Medicine

## 2022-08-31 NOTE — Anesthesia Preprocedure Evaluation (Addendum)
Anesthesia Evaluation    AIRWAY    Mallampati: II    TM distance: >3 FB  Neck ROM: full  Mouth Opening:full  Planned to use difficult airway equipment: No CARDIOVASCULAR    cardiovascular exam normal, regular and normal       DENTAL    no notable dental hx               PULMONARY    pulmonary exam normal and clear to auscultation     OTHER FINDINGS                                        Relevant Problems   ANESTHESIA   (+) Obstructive sleep apnea syndrome      PULMONARY   (+) Obstructive sleep apnea syndrome      CARDIO   (+) Congestive heart failure   (+) Hypertensive disorder      ENDO   (+) Type 2 diabetes mellitus with stage 3a chronic kidney disease, without long-term current use of insulin   (+) Type 2 diabetes mellitus without complication      OTHER   (+) Arthritis of knee   (+) Rheumatoid arthritis, involving unspecified site, unspecified whether rheumatoid factor present       PSS Anesthesia Comments: HTN, OSA, DM, Asthma,         Anesthesia Plan    ASA 3     general                     intravenous induction   Detailed anesthesia plan: general IV  Monitors/Adjuncts: other    Post Op: other  Trial extubation is not planned.    Post op pain management: per surgeon    informed consent obtained    Plan discussed with CRNA.                     Signed by: Gershon Cull, MD 08/31/22 3:58 PM

## 2022-09-01 ENCOUNTER — Encounter (INDEPENDENT_AMBULATORY_CARE_PROVIDER_SITE_OTHER): Payer: Self-pay | Admitting: Family Medicine

## 2022-09-01 ENCOUNTER — Ambulatory Visit: Payer: Medicare PPO | Admitting: Pain Medicine

## 2022-09-01 ENCOUNTER — Ambulatory Visit: Payer: Self-pay

## 2022-09-01 ENCOUNTER — Ambulatory Visit
Admission: RE | Admit: 2022-09-01 | Discharge: 2022-09-01 | Disposition: A | Discharge status: Home / Self Care | Payer: Medicare PPO | Source: Ambulatory Visit | Attending: Gastroenterology | Admitting: Gastroenterology

## 2022-09-01 ENCOUNTER — Encounter
Admission: RE | Disposition: A | Discharge status: Home / Self Care | Payer: Self-pay | Source: Ambulatory Visit | Attending: Gastroenterology

## 2022-09-01 ENCOUNTER — Encounter: Payer: Self-pay | Admitting: Gastroenterology

## 2022-09-01 DIAGNOSIS — E1122 Type 2 diabetes mellitus with diabetic chronic kidney disease: Secondary | ICD-10-CM | POA: Insufficient documentation

## 2022-09-01 DIAGNOSIS — I509 Heart failure, unspecified: Secondary | ICD-10-CM | POA: Insufficient documentation

## 2022-09-01 DIAGNOSIS — Z1211 Encounter for screening for malignant neoplasm of colon: Secondary | ICD-10-CM | POA: Insufficient documentation

## 2022-09-01 DIAGNOSIS — Z86010 Personal history of colonic polyps: Secondary | ICD-10-CM | POA: Insufficient documentation

## 2022-09-01 DIAGNOSIS — I130 Hypertensive heart and chronic kidney disease with heart failure and stage 1 through stage 4 chronic kidney disease, or unspecified chronic kidney disease: Secondary | ICD-10-CM | POA: Insufficient documentation

## 2022-09-01 DIAGNOSIS — G4733 Obstructive sleep apnea (adult) (pediatric): Secondary | ICD-10-CM | POA: Insufficient documentation

## 2022-09-01 DIAGNOSIS — N1831 Chronic kidney disease, stage 3a: Secondary | ICD-10-CM | POA: Insufficient documentation

## 2022-09-01 DIAGNOSIS — M1710 Unilateral primary osteoarthritis, unspecified knee: Secondary | ICD-10-CM | POA: Insufficient documentation

## 2022-09-01 DIAGNOSIS — K635 Polyp of colon: Secondary | ICD-10-CM | POA: Insufficient documentation

## 2022-09-01 DIAGNOSIS — J45909 Unspecified asthma, uncomplicated: Secondary | ICD-10-CM | POA: Insufficient documentation

## 2022-09-01 DIAGNOSIS — K648 Other hemorrhoids: Secondary | ICD-10-CM | POA: Insufficient documentation

## 2022-09-01 DIAGNOSIS — K5730 Diverticulosis of large intestine without perforation or abscess without bleeding: Secondary | ICD-10-CM | POA: Insufficient documentation

## 2022-09-01 DIAGNOSIS — M069 Rheumatoid arthritis, unspecified: Secondary | ICD-10-CM | POA: Insufficient documentation

## 2022-09-01 HISTORY — PX: COLONOSCOPY, DIAGNOSTIC (SCREENING): SHX174

## 2022-09-01 SURGERY — DONT USE, USE 1094-COLONOSCOPY, DIAGNOSTIC (SCREENING)
Anesthesia: Anesthesia General | Site: Anus

## 2022-09-01 MED ORDER — LACTATED RINGERS IV SOLN
INTRAVENOUS | Status: DC
Start: 2022-09-01 — End: 2022-09-01

## 2022-09-01 MED ORDER — LACTATED RINGERS IV SOLN
INTRAVENOUS | Status: DC | PRN
Start: 2022-09-01 — End: 2022-09-01

## 2022-09-01 MED ORDER — PROPOFOL INFUSION 10 MG/ML
INTRAVENOUS | Status: DC | PRN
Start: 2022-09-01 — End: 2022-09-01
  Administered 2022-09-01: 100 ug/kg/min via INTRAVENOUS

## 2022-09-01 MED ORDER — PROPOFOL 10 MG/ML IV EMUL (WRAP)
INTRAVENOUS | Status: DC | PRN
Start: 2022-09-01 — End: 2022-09-01
  Administered 2022-09-01: 100 mg via INTRAVENOUS

## 2022-09-01 SURGICAL SUPPLY — 29 items
CAP ENDOSCOPE SEALING HYDRA OLYMPUS L16 (GE Lab Supplies) ×1
CAP ENDOSCOPE SEALING HYDRA OLYMPUS L16 IN L18IN WATER BTTL CO2 CLIP (GE Lab Supplies) ×1 IMPLANT
CATHETER OD7 FR L300 CM BIPOLAR ROUND (Procedure Accessories)
CATHETER OD7 FR L300 CM BIPOLAR ROUND DISTAL TIP STANDARD CONNECTOR (Procedure Accessories) IMPLANT
ELECTRODE ADULT PATIENT RETURN L9 FT REM POLYHESIVE ACRYLIC FOAM (Procedure Accessories) IMPLANT
ELECTRODE PATIENT RETURN L9 FT VALLEYLAB (Procedure Accessories)
FORCEPS BIOPSY L240 CM JUMBO MICROMESH (Instrument)
FORCEPS BIOPSY L240 CM JUMBO MICROMESH TEETH STREAMLINE CATHETER (Instrument) IMPLANT
FORCEPS BIOPSY L240 CM LARGE CAPACITY (Instrument)
FORCEPS BIOPSY L240 CM MICROMESH TEETH STREAMLINE CATHETER NEEDLE (Instrument) IMPLANT
MARKER ENDOSCOPIC PERMANENT INDICATION (Syringes, Needles)
MARKER ENDOSCOPIC PERMANENT INDICATION DARK SYRINGE SPOT EX 5 ML (Syringes, Needles) IMPLANT
NEEDLE SCLEROTHERAPY CARR-LOCKE OD25 GA ODSEC2.5 MM L230 CM INJECTION (Needles) IMPLANT
NEEDLE SCLEROTHERAPY OD25 GA ODSEC2.5 MM (Needles)
PROBE ELECTROSURGICAL L220 CM FLEXIBLE (Procedure Accessories)
PROBE ELECTROSURGICAL L220 CM FLEXIBLE STRAIGHT FIRE OD2.3 MM FIAPC (Procedure Accessories) IMPLANT
SNARE ESCP MIC CPTVTR 13MM 240IN STRL (GE Lab Supplies)
SNARE MEDIUM OVAL L240 CM OD2.4 MM (Procedure Accessories)
SNARE MEDIUM OVAL L240 CM OD2.4 MM SENSATION LOOP SHORT THROW FLEXIBLE (Procedure Accessories) IMPLANT
SNARE SMALL HEXAGON CAPTIVATOR STIFF ENDOSCOPIC POLYPECTOMY (GE Lab Supplies) IMPLANT
SNARE SPIRAL 230CM 2.8MM 20MM SNRMSTR RDG WIRE ENDOSCOPIC POLYPECTOMY (Procedure Accessories) IMPLANT
SNARE SPIRAL L230 CM OD2.8 MM ODSEC20 MM (Procedure Accessories)
SPONGE GAUZE L4 IN X W4 IN 16 PLY (Dressing) ×1
SPONGE GAUZE L4 IN X W4 IN 16 PLY MAXIMUM ABSORBENT USP TYPE VII (Dressing) ×1 IMPLANT
SYRINGE 50 ML GRADUATE NONPYROGENIC DEHP (Syringes, Needles)
SYRINGE 50 ML GRADUATE NONPYROGENIC DEHP FREE PVC FREE BD MEDICAL (Syringes, Needles) IMPLANT
TRAP MUCUS SCREW CAP TUBE ID LABEL (Procedure Accessories)
TRAP MUCUS SCREW CAP TUBE ID LABEL MEDLINE PLASTIC CLEAR (Procedure Accessories) IMPLANT
TUBING IRRIGATION HYDRA (Tubing) ×1 IMPLANT

## 2022-09-01 NOTE — Nursing Progress Note (Signed)
24    Awaiting for attending Surgeon for the  in person instructions.

## 2022-09-01 NOTE — H&P (Signed)
GI PRE PROCEDURE NOTE    Proceduralist Comments:   Review of Systems and Past Medical / Surgical History performed: Yes     Indications:History of colonic polyps    Previous Adverse Reaction to Anesthesia or Sedation (if yes, describe): No    Physical Exam / Laboratory Data (If applicable)   General: Alert and cooperative  Lungs: Lungs clear to auscultation  Cardiac: RRR, normal S1S2.    Abdomen: Soft, non tender. Normal active bowel sounds  Other:     Outside labs reviewed    American Society of Anesthesiologists (ASA) Physical Status Classification:   Anesthesia ASA Score: see anesthesia note        Planned Sedation:   Deep sedation with anesthesia    Attestation:   Caitlin Malone has been reassessed immediately prior to the procedure and is an appropriate candidate for the planned sedation and procedure. The colonoscopy procedure was explained to the patient, including indication, alternatives, possible benefits, and potential risks (including but not limited to bleeding, infection, perforation, aspiration, splenic avulsion, anesthesia complications such as slowed breathing and lowered blood pressure). The patient appeared to understand and agrees to proceed.  Risks, benefits and alternatives to the planned procedure and sedation have been explained to the patient or guardian:  yes        Signed by: Marlowe Sax, MD

## 2022-09-01 NOTE — Discharge Instr - AVS First Page (Addendum)
Endoscopy Discharge Instructions  General Instructions:  1. Following sedation, your judgement, perception, and coordination are considered impaired. Even though you may feel awake and alert, you are considered legally intoxicated. Therefore, until the next morning;  Do not Drive  Do not operate appliances or equipment that requires reaction time (e.g. stove, electrical tools, machinery)  Do not sign legal documents or be involved in important decisions.  Do not smoke if alone  Do not drink alcoholic beverages  Go directly home and rest for several hours before resuming your routine activities.  It is highly recommended to have a responsible adult stay with you for the next 24 hours    2. Tenderness, swelling or pain may occur at the IV site where you received sedation. If you experience this, apply warm soaks to the area. Notify your physician if this persists.    Instructions Specific To Procedures - Report To Physician Any Of The Following:  Colon/Sigmoidoscopy/Proctoscopy   1. Severe and persistent abdominal pain/bloating which does not subside within   2-3 hours   2. Large amount of rectal bleeding (some mucosal blood streaking may occur,   especially if biopsy or polypectomy was done or if hemorrhoids are present.   3. Nausea/vomitting   4. Fevers/Chills within 24 hours after procedure. Temp>101deg F     In Addition:   If polyp has been removed, DO NOT take aspirin or aspirin containing products   (e.g. Anacin, Alka Seltzer, Bufferin, Etc.) or non-steroidal anti-inflammatory drugs   (e.g. Advil, Motrin, etc.) for 2-3 days unless otherwise advised by doctor. Tylenol   or extra Strength Tylenol is permitted.    Additional Discharge Instructions  Your diet after the procedure: Avoid heavy/fatty/greasy/spicy/fried food for 24 hours.  Start with easily digestible food like soup/crackers.        If you have questions or problems contact your MD immediately. If you need immediate attention, call your MD, 911 and/or  go to nearest emergency room.

## 2022-09-01 NOTE — Nursing Progress Note (Addendum)
Patient discharge criteria met. Patient tolerating clear liquids. RN Reviewed discharge instructions with patient and family member Hotel manager)  RN answered all questions and assessed patient. Patient verbalized understandings. Patient vital signs stable with no apparent distress. See charting for detailed documentation. Patient offered assistance to get dressed. Patient discharged via wheelchair to home.

## 2022-09-01 NOTE — Transfer of Care (Signed)
Anesthesia Transfer of Care Note    Patient: Caitlin Malone    Procedures performed: Procedure(s):  COLONOSCOPY    Anesthesia type: MAC    Patient location:Phase II PACU    Last vitals:   Vitals:    09/01/22 0820   BP: 125/81   Pulse: 61   Resp: 17   Temp: 36.8 C (98.3 F)   SpO2: 98%       Post pain: Patient not complaining of pain, continue current therapy      Mental Status:awake and alert     Respiratory Function: tolerating room air    Cardiovascular: stable    Nausea/Vomiting: patient not complaining of nausea or vomiting    Hydration Status: adequate    Post assessment: no apparent anesthetic complications, no reportable events, and no evidence of recall    Signed by: Jacob Moores, CRNA  09/01/22 9:40 AM

## 2022-09-01 NOTE — Anesthesia Postprocedure Evaluation (Addendum)
Anesthesia Post Evaluation    Patient: Caitlin Malone    Procedure(s):  COLONOSCOPY    Anesthesia type: general    Last Vitals:   Vitals Value Taken Time   BP 126/60 09/01/22 0942   Temp 36.1 C (97 F) 09/01/22 0942   Pulse 54 09/01/22 0942   Resp  09/01/22 0950   SpO2 98 % 09/01/22 0942                 Anesthesia Post Evaluation:     Patient Evaluated: PACU  Patient Participation: complete - patient participated  Level of Consciousness: awake  Pain Score: 0  Pain Management: adequate    Airway Patency: patent        Anesthetic complications: No      PONV Status: none    Cardiovascular status: stable  Respiratory status: room air  Hydration status: stable  Comments: Endoscopy    Quality/Regulatory Reporting Exceptions  : [facility].  Medical reason(s) for not administering 2+ classes of antiemetics: other (use comment) ([facility])  Medical reason(s) for not using multimodal pain management: no pain reported ([facility])  : [facility].  : [facility].        Signed by: Gershon Cull, MD, 09/01/2022 9:50 AM

## 2022-09-02 ENCOUNTER — Encounter: Payer: Self-pay | Admitting: Gastroenterology

## 2022-09-07 ENCOUNTER — Telehealth (INDEPENDENT_AMBULATORY_CARE_PROVIDER_SITE_OTHER): Payer: Medicare PPO | Admitting: Family Medicine

## 2022-09-07 ENCOUNTER — Ambulatory Visit: Payer: Medicare PPO | Admitting: Neurology

## 2022-09-07 ENCOUNTER — Encounter (INDEPENDENT_AMBULATORY_CARE_PROVIDER_SITE_OTHER): Payer: Self-pay | Admitting: Family Medicine

## 2022-09-07 VITALS — Wt 247.0 lb

## 2022-09-07 DIAGNOSIS — I509 Heart failure, unspecified: Secondary | ICD-10-CM

## 2022-09-07 DIAGNOSIS — M1710 Unilateral primary osteoarthritis, unspecified knee: Secondary | ICD-10-CM

## 2022-09-07 DIAGNOSIS — I10 Essential (primary) hypertension: Secondary | ICD-10-CM

## 2022-09-07 LAB — LAB USE ONLY - HISTORICAL SURGICAL PATHOLOGY

## 2022-09-07 NOTE — Progress Notes (Signed)
[place]                       Date of Exam: 09/07/2022 9:58 AM        Patient ID: Caitlin Malone is a 67 y.o. female.  Attending Physician: Yetta Numbers, FNP         Patient is seen by a virtual visit today, with real time audio and video call, during the Coronavirus (0000000) public health emergency. Patient gives verbal consent to continue with the telemedicine visit. Patient lives in [place] and is currently at home. Identity was verified by an existing patient relationship. I educated patient on the virtual visit platform and shared my credentials. I informed the patient that if the virtual visit was interrupted, I would call back on their home/cell phone.    This note was dictated using [employer], by [employer], a voice recognition speech to text software, and may contain inadvertent recognition errors. There may be inherent errors or limitations not intended by the user. Not all errors are caught or corrected. If there are questions or concerns about the content of this note or the information contained within the body of this dictation they should be addressed directly with the author for clarification.    Subjective:   Chief Complaint:  Chief Complaint   Patient presents with    Hypertension       HPI:  HPI     Patient is a 67 y.o. female who presents today for a vv for Blood pressure follow-up.    Patient has a past medical history of htn, diabetes type 2, CHF, osteopenia and, PE, and OSA,  and CKD.     Patient was recently seen August 23, 2022 for chronic care follow-up.  Her blood pressure medication was reduced due to lower readings and she experiencing lightheadedness.  She was advised to decrease her Lasix to 20 mg once daily (previously twice daily dosing )and monitor blood pressure readings.  Patient is also taking olmesartan 40 mg daily and carvedilol 3.118m bid.   Pt has been monitoring her blood pressures and  admits they have been ranging 115-140/ 80's  consistently  She has been working hard on weight loss.  She Has lost over 20llbs since last September through diet and exercise.  She states she has not been getting much lower leg swelling  Her lightheadedness has resolved  No headache, chest pain,dizziness or palpitations.      She also recently began hydrotherapy for her knees last week.      Problem List:  Patient Active Problem List   Diagnosis    Type 2 diabetes mellitus without complication    Obstructive sleep apnea syndrome    Morbid obesity    Hypertensive disorder    Encounter for weight loss counseling    Edema of lower extremity    Diabetes mellitus    Congestive heart failure    Arthritis of knee    Rheumatoid arthritis, involving unspecified site, unspecified whether rheumatoid factor present    Snoring    History of pulmonary embolism    Type 2 diabetes mellitus with stage 3a chronic kidney disease, without long-term current use of insulin    History of colon polyps    Osteopenia, unspecified location       Current Medications:  Current Outpatient Medications   Medication Sig Dispense Refill    ACETAMINOPHEN PO Take 650 mg  by mouth every 6 (six) hours as needed      albuterol sulfate HFA (PROVENTIL) 108 (90 Base) MCG/ACT inhaler Inhale 2 puffs into the lungs every 4 (four) hours as needed for Wheezing      apixaban (Eliquis) 5 MG Take 1 tablet (5 mg) by mouth every 12 (twelve) hours 60 tablet 2    Calcium 200 MG Tab Take 1 tablet (200 mg) by mouth every morning      carvedilol (COREG) 3.125 MG tablet Take 1 tablet (3.125 mg) by mouth Twice a day      furosemide (LASIX) 40 MG tablet TAKE 1 TABLET BY MOUTH EVERY MORNING (Patient taking differently: Take 0.5 tablets (20 mg) by mouth every morning Takes 83m daily) 90 tablet 0    latanoprost (XALATAN) 0.005 % ophthalmic solution Place 1 drop into both eyes nightly      Loratadine (CLARITIN PO) Take 10 mg by mouth every morning      montelukast (SINGULAIR) 10  MG tablet Take 1 tablet (10 mg) by mouth nightly      Multiple Vitamins-Minerals (multivitamin with minerals) tablet Take 1 tablet by mouth every morning      olmesartan (BENICAR) 40 MG tablet TAKE 1 TABLET BY MOUTH EVERY MORNING 90 tablet 2    predniSONE (DELTASONE) 10 MG tablet Take 1 tablet (10 mg) by mouth 3 (three) times daily      timolol (TIMOPTIC) 0.5 % ophthalmic solution Place 1 drop into both eyes every morning      VITAMIN D PO Take 10,000 Units by mouth every morning       No current facility-administered medications for this visit.       Allergies:  No Known Allergies    Immunizations:  Immunization History   Administered Date(s) Administered    COVID-19 mRNA BIVALENT vaccine 12 years and above ([employer] 30 mcg/0.3 mL 06/12/2021    COVID-19 mRNA MONOVALENT vaccine PRIMARY SERIES 12 years and above (Moderna) 100 mcg/0.5 mL 10/11/2019, 11/02/2019    COVID-19 mRNA MONOVALENT vaccine PRIMARY SERIES 12 years and above ([employer] 30 mcg/0.3 mL (DILUTE BEFORE USE) 10/11/2019, 10/23/2019, 04/01/2020    COVID-19 mRNA MONOVALENT vaccine PRIMARY SERIES 12 years and above ([employer] 30 mcg/0.3 mL (DO NOT DILUTE) 11/21/2020    INFLUENZA HIGH DOSE 65 YRS+ 07/24/2012    INFLUENZA HIGH DOSE 65 YRS+ Quad 0.7 mL 04/17/2022    Influenza (Im) Preservative Free 04/27/2015, 05/25/2015, 06/01/2016    Influenza Whole 06/10/2007    Influenza vaccine, QUADRIVALENT, 6 months and older (AFLURIA/FLUARIX/FLULAVAL/FLUZONE), single-dose preservative free, 0.5 mL 06/12/2021    Influenza vaccine, quadrivalent, 6 months and older (Afluria/Fluzone), multi-dose, 5 mL 04/23/2018, 05/24/2018    Pneumococcal 23 valent 08/02/2016    Pneumococcal Conjugate 20-Valent 04/17/2022    Pneumococcal Conjugate, NOS 05/24/2016    Tdap 08/01/2021        Past Medical History:  Past Medical History:   Diagnosis Date    Arthritis     Asthma     Bilateral cataracts     Colon polyps 2012    Congestive heart failure     controlled with meds    COVID-19 vaccine  administered     Gastroesophageal reflux disease     Glaucoma     [facility] Chiari malformation [facility]    Hypertension     controlled on meds    Morbid obesity with BMI of 40.0-44.9, adult     Prediabetes  last A1c 5.0 04/17/22    Pulmonary embolism 2016    on Eliquis    Sleep apnea     OSA with CPAP       Past Surgical History:  Past Surgical History:   Procedure Laterality Date    CATARACT EXTRACTION Right 08/17/2022    glacoma removal as well    COLONOSCOPY N/A 09/01/2022    Procedure: COLONOSCOPY;  Surgeon: Marlowe Sax, MD;  [facility];  Service: Gastroenterology;  Laterality: N/A;    dental implants  06/16/2022    KNEE SURGERY Right 2012    TOOTH EXTRACTION  05/16/2022    upper       Family History:  Family History   Problem Relation Age of Onset    Cancer Mother     Diabetes Mother     Arthritis Mother     Obesity Mother     Cancer Maternal Grandfather        Social History:  Social History     Socioeconomic History    Marital status: Married   Tobacco Use    Smoking status: Never    Smokeless tobacco: Never   Vaping Use    Vaping Use: Never used   Substance and Sexual Activity    Alcohol use: Not Currently    Drug use: Never   Other Topics Concern    Dietary supplements / vitamins No    Anesthesia problems No    Blood thinners No    Eats large amounts No    Excessive Sweets No    Skips meals No    Eats excessive starches No    Snacks or grazes Yes    Emotional eater Yes    Eats fried food No    Eats fast food No    Diet Center No    [facility] Patience No    [facility] Weight Loss No    Nutri-System No    Opti-Fast / Medi-Fast No    Overeaters Anonymous No    [facility] Yes    TOPS No    Weight Watchers No    Atkins No    Binging / Purging No    Calorie Counting No    Fasting No    High Protein Yes    Low Carb Yes    Low Fat Yes    [facility] Diet No    Slim Fast No    [place] No    Stationary cycle or treadmill No    Gym/fitness Classes No     Home exercise/video No    Swimming No    Weight training No    Walking or running Yes    Hospitalization No    Hypnosis No    Physical therapy No    Psychological therapy No    Residential program No    Acutrim No    [place] No    Contrave No    Dexatrim No    Diethylpropion No    Fastin No    Fen - Phen No    Ionamin / Adipex No    Phentermine No    Qsymia No    Prozac No    Saxenda No    Topamax No    Wellbutrin No    Xenical (Orlistat, Alli) No    Other Med No    No impairment No    Walks with cane/crutch No    Requires a wheelchair No  Bedridden No    Are you currently being treated for depression? No    Do you snore? No    Are you receiving any medical or psychological services? No    Do you ever wake up at night gasping for breath? No    Do you have or have you been treated for an eating disorder? No    Anyone ever told you that you stop breathing while asleep? No    Do you exercise regularly? Yes    Have you or family member ever have trouble with anesthesia? No     Social Determinants of Health     Financial Resource Strain: Low Risk  (09/07/2022)    Overall Financial Resource Strain (CARDIA)     Difficulty of Paying Living Expenses: Not very hard   Food Insecurity: No Food Insecurity (09/07/2022)    Hunger Vital Sign     Worried About Running Out of Food in the Last Year: Never true     Ran Out of Food in the Last Year: Never true   Transportation Needs: No Transportation Needs (09/07/2022)    PRAPARE - Armed forces logistics/support/administrative officer (Medical): No     Lack of Transportation (Non-Medical): No   Physical Activity: Insufficiently Active (09/07/2022)    Exercise Vital Sign     Days of Exercise per Week: 3 days     Minutes of Exercise per Session: 40 min   Stress: No Stress Concern Present (09/07/2022)    [place]     Feeling of Stress : Not at all   Social Connections: [place] (09/07/2022)    Social Connection and Isolation  Panel [NHANES]     Frequency of Communication with Friends and Family: More than three times a week     Frequency of Social Gatherings with Friends and Family: More than three times a week     Attends Religious Services: More than 4 times per year     Active Member of [employer] or Organizations: Yes     Attends Music therapist: More than 4 times per year     Marital Status: Married   Human resources officer Violence: Not At Risk (09/07/2022)    Humiliation, Afraid, Rape, and Kick questionnaire     Fear of Current or Ex-Partner: No     Emotionally Abused: No     Physically Abused: No     Sexually Abused: No   Housing Stability: Low Risk  (09/07/2022)    Housing Stability Vital Sign     Unable to Pay for Housing in the Last Year: No     Number of [place] in the Last Year: 2     Unstable Housing in the Last Year: No        The following sections were reviewed this encounter by the provider:   Tobacco  Allergies  Meds  Problems  Med Hx  Surg Hx  Fam Hx         ROS:  Review of Systems   Review of systems as noted in the HPI.    Vitals:  Wt 112 kg (247 lb)   BMI 40.48 kg/m      Objective:     Physical Exam:  Physical Exam   Constitutional: General Appearance: well developed. Level of Distress: no acute distress.    Lungs / Chest: Respiratory effort: unlabored, speaking in full sentences, and no audible wheezing.  Mental Status Exam: Orientation alert and oriented. Affect: appropriate affect. Mood: appropriate mood. Language and Speech: normal speech and comprehension.     Assessment:     1. Hypertension, unspecified type    2. Congestive heart failure, unspecified HF chronicity, unspecified heart failure type    3. Arthritis of knee      Plan:   1. Hypertension, unspecified type/2. Congestive heart failure, unspecified HF chronicity, unspecified heart failure type  Patient continue Lasix 20 mg once daily along with olmesartan 40 mg daily and carvedilol 3.186m bid.   Continue to monitor blood pressures at  home.   Patient to monitor fluid retention and lower leg swelling.  Continue with regular exercise and healthy diet  Continue to follow with cardiology as indicated  Patient to follow-up in 6 months, sooner if needed  3. Arthritis of knee  Patient to continue with hydrotherapy  Patient to follow-up if needed    KYetta Numbers FNP

## 2022-10-20 NOTE — Addendum Note (Signed)
Addendum  created 10/20/22 1420 by Gershon Cull, MD    Clinical Note Signed, Intraprocedure Event edited

## 2023-01-12 ENCOUNTER — Other Ambulatory Visit (INDEPENDENT_AMBULATORY_CARE_PROVIDER_SITE_OTHER): Payer: Self-pay | Admitting: Family Medicine

## 2023-01-12 DIAGNOSIS — I10 Essential (primary) hypertension: Secondary | ICD-10-CM

## 2023-01-17 ENCOUNTER — Ambulatory Visit (INDEPENDENT_AMBULATORY_CARE_PROVIDER_SITE_OTHER): Payer: Medicare PPO | Admitting: Family Medicine

## 2023-01-17 ENCOUNTER — Encounter (INDEPENDENT_AMBULATORY_CARE_PROVIDER_SITE_OTHER): Payer: Self-pay | Admitting: Family Medicine

## 2023-01-17 VITALS — BP 124/80 | HR 62 | Temp 97.0°F | Resp 16 | Wt 260.0 lb

## 2023-01-17 DIAGNOSIS — I509 Heart failure, unspecified: Secondary | ICD-10-CM

## 2023-01-17 DIAGNOSIS — Z86711 Personal history of pulmonary embolism: Secondary | ICD-10-CM

## 2023-01-17 DIAGNOSIS — D649 Anemia, unspecified: Secondary | ICD-10-CM

## 2023-01-17 DIAGNOSIS — L819 Disorder of pigmentation, unspecified: Secondary | ICD-10-CM

## 2023-01-17 DIAGNOSIS — M069 Rheumatoid arthritis, unspecified: Secondary | ICD-10-CM

## 2023-01-17 MED ORDER — APIXABAN 5 MG PO TABS
5.0000 mg | ORAL_TABLET | Freq: Two times a day (BID) | ORAL | 1 refills | Status: DC
Start: 2023-01-17 — End: 2023-01-18

## 2023-01-17 MED ORDER — FUROSEMIDE 20 MG PO TABS
20.0000 mg | ORAL_TABLET | Freq: Every morning | ORAL | 1 refills | Status: DC
Start: 2023-01-17 — End: 2023-01-18

## 2023-01-17 MED ORDER — CLOTRIMAZOLE 1 % EX CREA
TOPICAL_CREAM | Freq: Two times a day (BID) | CUTANEOUS | 0 refills | Status: DC
Start: 2023-01-17 — End: 2023-01-18

## 2023-01-17 MED ORDER — DICLOFENAC SODIUM 1 % EX GEL
2.0000 g | Freq: Four times a day (QID) | CUTANEOUS | 0 refills | Status: DC
Start: 2023-01-17 — End: 2023-01-18

## 2023-01-17 NOTE — Progress Notes (Signed)
[facility]                       Date of Exam: 01/17/2023 8:38 AM        Patient ID: Caitlin Malone is a 67 y.o. female.  Attending Physician: Tyrell Antonio, FNP           This note was dictated using Dragon Medical One software, by [employer], a voice recognition speech to text software, and may contain inadvertent recognition errors. There may be inherent errors or limitations not intended by the user. Not all errors are caught or corrected. If there are questions or concerns about the content of this note or the information contained within the body of this dictation they should be addressed directly with the author for clarification.    Subjective:   Chief Complaint:Patient is a 67 y.o. female seen in the office today for   Chief Complaint   Patient presents with    Skin Discoloration     Patient noticed a discoloration on her right knee 1 month ago and it has gotten bigger since she noticed it     Medication Refill     Patient would like her medication refilled     Medication Request     Patient would like a topical cream for arthritis in her knee        HPI:  HPI     Patient is a 67 y.o. female seen in the office today for skin concerns.  Patient has a past medical history of htn, diabetes type 2, CHF, osteopenia and, PE, and OSA,  and CKD     Skin concern  Initial distribution: Patient noticed some lightening of her skin over her left knee about 6 weeks ago.  She states it is grown in size over the last few weeks but recently has not changed.    No otc methods besides lotion over skin  Admits she was told in the past she has had a folic acid deficiency and was taking supplements.  She is unsure if it is related.  Denies any pain itching swelling redness discharge.  Denies any drainage  Patient has not had new exposures (soaps, lotions, laundry detergents, foods, medications, plants, insects or animals.)  She has received cortisone injections over  both of her knees for arthritis.      Htn/chf  Pt taking lasix 20 mg once daily (previously twice daily dosing), olmesartan 40 mg daily and carvedilol 3.125mg  bid.   Pt has been monitoring her blood pressures and admits they have been ranging 115-140/ 80's  consistently  She has been working hard on weight loss.  She was previously taking Ozempic however she stopped the medication.  She is worried she is going to gain the weight back.  Patient is requesting possible medication such as a starch blocker to help her not to gain the weight back.  Patient request refills of her medications.  She is about to go on a trip to [place] in [place].  She is not due for chronic care visit with fasting labs until next month  Denies any problems with medication.    Patient also has a history of arthritis of her knees.  She request topical Voltaren to use.  She knows she will be doing a lot of walking while out of town.  Problem List:  Patient Active Problem List   Diagnosis    Type 2  diabetes mellitus without complication    Obstructive sleep apnea syndrome    Morbid obesity    Hypertensive disorder    Encounter for weight loss counseling    Edema of lower extremity    Diabetes mellitus    Congestive heart failure    Arthritis of knee    Rheumatoid arthritis, involving unspecified site, unspecified whether rheumatoid factor present    Snoring    History of pulmonary embolism    Type 2 diabetes mellitus with stage 3a chronic kidney disease, without long-term current use of insulin    History of colon polyps    Osteopenia, unspecified location       Current Medications:  Current Outpatient Medications   Medication Sig Dispense Refill    ACETAMINOPHEN PO Take 650 mg by mouth every 6 (six) hours as needed      albuterol sulfate HFA (PROVENTIL) 108 (90 Base) MCG/ACT inhaler Inhale 2 puffs into the lungs every 4 (four) hours as needed for Wheezing      Calcium 200 MG Tab Take 1 tablet (200 mg) by mouth every morning      carvedilol (COREG)  3.125 MG tablet Take 1 tablet (3.125 mg) by mouth Twice a day      latanoprost (XALATAN) 0.005 % ophthalmic solution Place 1 drop into both eyes nightly      Loratadine (CLARITIN PO) Take 10 mg by mouth every morning      montelukast (SINGULAIR) 10 MG tablet Take 1 tablet (10 mg) by mouth nightly      Multiple Vitamins-Minerals (multivitamin with minerals) tablet Take 1 tablet by mouth every morning      olmesartan (BENICAR) 40 MG tablet TAKE ONE TABLET BY MOUTH EVERY MORNING 90 tablet 1    timolol (TIMOPTIC) 0.5 % ophthalmic solution Place 1 drop into both eyes every morning      VITAMIN D PO Take 10,000 Units by mouth every morning      apixaban (Eliquis) 5 MG Take 1 tablet (5 mg) by mouth every 12 (twelve) hours 180 tablet 1    clotrimazole (LOTRIMIN) 1 % cream Apply topically 2 (two) times daily 12 g 0    diclofenac Sodium (VOLTAREN) 1 % Gel topical gel Apply 2 g topically 4 (four) times daily 50 g 0    furosemide (LASIX) 20 MG tablet Take 1 tablet (20 mg) by mouth every morning 90 tablet 1     No current facility-administered medications for this visit.       Allergies:  No Known Allergies    Immunizations:  Immunization History   Administered Date(s) Administered    COVID-19 mRNA BIVALENT vaccine 12 years and above [employer]) 30 mcg/0.3 mL 06/12/2021    COVID-19 mRNA MONOVALENT vaccine PRIMARY SERIES 12 years and above (Moderna) 100 mcg/0.5 mL 10/11/2019, 11/02/2019    COVID-19 mRNA MONOVALENT vaccine PRIMARY SERIES 12 years and above [employer]) 30 mcg/0.3 mL (DILUTE BEFORE USE) 10/11/2019, 10/23/2019, 04/01/2020, 05/04/2020    COVID-19 mRNA MONOVALENT vaccine PRIMARY SERIES 12 years and above [employer]) 30 mcg/0.3 mL (DO NOT DILUTE) 11/21/2020    INFLUENZA HIGH DOSE 65 YRS+ 07/24/2012    Influenza (Flu) vaccine 04/27/2015, 05/25/2015, 06/01/2016    Influenza vaccine quadrivalent high-dose 65 years and older (FLUZONE HIGH-DOSE) single dose 0.7 mL (PF) 04/17/2022    Influenza vaccine whole virus 06/10/2007     Influenza vaccine, quadrivalent, 6 months and older (AFLURIA/FLUARIX/FLULAVAL/FLUZONE), single-dose preservative free, 0.5 mL 06/12/2021    Influenza vaccine, quadrivalent, 6 months and older (  Afluria/Fluzone), multi-dose, 5 mL 04/23/2018, 05/24/2018    Pneumococcal Conjugate 20-Valent 04/17/2022    Pneumococcal polysaccharide vaccine, 23 valent 08/02/2016    Pneumococcal vaccine 05/24/2016    Tdap (tetanus, diphtheria reduced, acellular pertussis), adsorbed vaccine 08/01/2021        Past Medical History:  Past Medical History:   Diagnosis Date    Arthritis     Asthma     Bilateral cataracts     Colon polyps 2012    Congestive heart failure     controlled with meds    COVID-19 vaccine administered     Gastroesophageal reflux disease     Glaucoma     History of Chiari malformation 03/19/2019    [facility], dx after fall    Hypertension     controlled on meds    Morbid obesity with BMI of 40.0-44.9, adult     Prediabetes     last A1c 5.0 04/17/22    Pulmonary embolism 2016    on Eliquis    Sleep apnea     OSA with CPAP       Past Surgical History:  Past Surgical History:   Procedure Laterality Date    CATARACT EXTRACTION Right 08/17/2022    glacoma removal as well    COLONOSCOPY, DIAGNOSTIC (SCREENING) N/A 09/01/2022    Procedure: COLONOSCOPY;  Surgeon: Kenney Houseman, MD;  Location: [facility];  Service: Gastroenterology;  Laterality: N/A;    dental implants  06/16/2022    KNEE SURGERY Right 2012    TOOTH EXTRACTION  05/16/2022    upper       Family History:  Family History   Problem Relation Age of Onset    Cancer Mother     Diabetes Mother     Arthritis Mother     Obesity Mother     Cancer Maternal Grandfather        Social History:  Social History     Socioeconomic History    Marital status: Married   Tobacco Use    Smoking status: Never    Smokeless tobacco: Never   Vaping Use    Vaping status: Never Used   Substance and Sexual Activity    Alcohol use: Not Currently    Drug use: Never   Other Topics Concern     Dietary supplements / vitamins No    Anesthesia problems No    Blood thinners No    Eats large amounts No    Excessive Sweets No    Skips meals No    Eats excessive starches No    Snacks or grazes Yes    Emotional eater Yes    Eats fried food No    Eats fast food No    Diet Center No    Doylene Bode No    LA Weight Loss No    Nutri-System No    Opti-Fast / Medi-Fast No    Overeaters Anonymous No    [facility] Yes    TOPS No    Weight Watchers No    Atkins No    Binging / Purging No    Calorie Counting No    Fasting No    High Protein Yes    Low Carb Yes    Low Fat Yes    [facility] Diet No    Slim Fast No    [place] No    Stationary cycle or treadmill No    Gym/fitness Classes No  Home exercise/video No    Swimming No    Weight training No    Walking or running Yes    Hospitalization No    Hypnosis No    Physical therapy No    Psychological therapy No    Residential program No    Acutrim No    [place] No    Contrave No    Dexatrim No    Diethylpropion No    Fastin No    Fen - Phen No    Ionamin / Adipex No    Phentermine No    Qsymia No    Prozac No    Saxenda No    Topamax No    Wellbutrin No    Xenical (Orlistat, Alli) No    Other Med No    No impairment No    Walks with cane/crutch No    Requires a wheelchair No    Bedridden No    Are you currently being treated for depression? No    Do you snore? No    Are you receiving any medical or psychological services? No    Do you ever wake up at night gasping for breath? No    Do you have or have you been treated for an eating disorder? No    Anyone ever told you that you stop breathing while asleep? No    Do you exercise regularly? Yes    Have you or family member ever have trouble with anesthesia? No     Social Determinants of Health     Financial Resource Strain: Low Risk  (09/07/2022)    Overall Financial Resource Strain (CARDIA)     Difficulty of Paying Living Expenses: Not very hard   Food Insecurity: No Food Insecurity (09/07/2022)     Hunger Vital Sign     Worried About Running Out of Food in the Last Year: Never true     Ran Out of Food in the Last Year: Never true   Transportation Needs: No Transportation Needs (09/07/2022)    PRAPARE - Therapist, art (Medical): No     Lack of Transportation (Non-Medical): No   Physical Activity: Insufficiently Active (09/07/2022)    Exercise Vital Sign     Days of Exercise per Week: 3 days     Minutes of Exercise per Session: 40 min   Stress: No Stress Concern Present (09/07/2022)    [employer] of Occupational Health - Occupational Stress Questionnaire     Feeling of Stress : Not at all   Social Connections: Socially Integrated (09/07/2022)    Social Connection and Isolation Panel [NHANES]     Frequency of Communication with Friends and Family: More than three times a week     Frequency of Social Gatherings with Friends and Family: More than three times a week     Attends Religious Services: More than 4 times per year     Active Member of [employer] or Organizations: Yes     Attends Banker Meetings: More than 4 times per year     Marital Status: Married   Catering manager Violence: Not At Risk (09/07/2022)    Humiliation, Afraid, Rape, and Kick questionnaire     Fear of Current or Ex-Partner: No     Emotionally Abused: No     Physically Abused: No     Sexually Abused: No    Received from [facility]    Housing Stability  The following sections were reviewed this encounter by the provider:   Tobacco  Allergies  Meds  Problems  Med Hx  Surg Hx  Fam Hx         ROS:  Review of Systems   Review of systems as noted in the HPI.    Vitals:  BP 124/80   Pulse 62   Temp 97 F (36.1 C)   Resp 16   Wt 117.9 kg (260 lb)   SpO2 97%   BMI 42.61 kg/m      Objective:     Physical Exam:  Physical Exam  Constitutional:       Appearance: She is obese.   Cardiovascular:      Rate and Rhythm: Normal rate.   Pulmonary:      Effort: Pulmonary effort is normal.    Skin:     Comments: Linear Area over left knee about 2 inches in diameter of hypopigmentation.  No scaling no discharge no redness.   Psychiatric:         Mood and Affect: Mood normal.         Behavior: Behavior normal.         Thought Content: Thought content normal.         Judgment: Judgment normal.               Assessment:     1. Discoloration of skin  - clotrimazole (LOTRIMIN) 1 % cream; Apply topically 2 (two) times daily  Dispense: 12 g; Refill: 0    2. Rheumatoid arthritis, involving unspecified site, unspecified whether rheumatoid factor present  - diclofenac Sodium (VOLTAREN) 1 % Gel topical gel; Apply 2 g topically 4 (four) times daily  Dispense: 50 g; Refill: 0    3. Morbid obesity    4. Other congestive heart failure  - furosemide (LASIX) 20 MG tablet; Take 1 tablet (20 mg) by mouth every morning  Dispense: 90 tablet; Refill: 1    5. Anemia, unspecified type  - Iron Profile; Future  - Vitamin B12; Future  - Ferritin; Future  - Iron Profile  - Vitamin B12  - Ferritin  - CBC with Differential; Future  - CBC with Differential    6. History of pulmonary embolism  - apixaban (Eliquis) 5 MG; Take 1 tablet (5 mg) by mouth every 12 (twelve) hours  Dispense: 180 tablet; Refill: 1      Plan:   1. Discoloration of skin  - clotrimazole (LOTRIMIN) 1 % cream; Apply topically 2 (two) times daily  Dispense: 12 g; Refill: 0  Discussed discoloration most likely due to hypopigmentation from steroids close to the skin.  Will provide patient with a trial of Lotrimin in case fungal related.  Patient advised to follow-up with dermatologist if symptoms persist.  List provided to patient.  Patient to follow-up if needed  2. Rheumatoid arthritis, involving unspecified site, unspecified whether rheumatoid factor present  - diclofenac Sodium (VOLTAREN) 1 % Gel topical gel; Apply 2 g topically 4 (four) times daily  Dispense: 50 g; Refill: 0  To use topical gel as needed.  Continue to follow with rheumatologist as indicated.   Patient to follow-up if needed  3. Morbid obesity  Discussed possibly starting new medication such as metformin to help with patient maintain weight.  Patient to follow-up in 1 to 2 months for fasting labs and chronic care visit.  Will discuss in further detail at upcoming visit.  4. Other congestive heart failure  -  furosemide (LASIX) 20 MG tablet; Take 1 tablet (20 mg) by mouth every morning  Dispense: 90 tablet; Refill: 1  Patient to continue medication as prescribed.  Patient to follow-up  1 to 2 months for fasting labs and chronic care visit.    5. Anemia, unspecified type  - Iron Profile; Future  - Vitamin B12; Future  - Ferritin; Future  - Iron Profile  - Vitamin B12  - Ferritin  - CBC with Differential; Future  Due to patient's will check anemia profile along with CBC.  Follow-up and recommendations based on results  6. History of pulmonary embolism  - apixaban (Eliquis) 5 MG; Take 1 tablet (5 mg) by mouth every 12 (twelve) hours  Dispense: 180 tablet; Refill: 1  Patient continue medication as prescribed.  Patient to get up throughout the plane ride to move legs.  Compression socks monitor symptoms.  Patient to follow-up if needed  Tyrell Antonio, FNP

## 2023-01-18 ENCOUNTER — Other Ambulatory Visit (INDEPENDENT_AMBULATORY_CARE_PROVIDER_SITE_OTHER): Payer: Self-pay

## 2023-01-18 DIAGNOSIS — Z86711 Personal history of pulmonary embolism: Secondary | ICD-10-CM

## 2023-01-18 DIAGNOSIS — M069 Rheumatoid arthritis, unspecified: Secondary | ICD-10-CM

## 2023-01-18 DIAGNOSIS — I509 Heart failure, unspecified: Secondary | ICD-10-CM

## 2023-01-18 DIAGNOSIS — I10 Essential (primary) hypertension: Secondary | ICD-10-CM

## 2023-01-18 DIAGNOSIS — L819 Disorder of pigmentation, unspecified: Secondary | ICD-10-CM

## 2023-01-18 MED ORDER — DICLOFENAC SODIUM 1 % EX GEL
2.0000 g | Freq: Four times a day (QID) | CUTANEOUS | 0 refills | Status: AC
Start: 2023-01-18 — End: ?

## 2023-01-18 MED ORDER — APIXABAN 5 MG PO TABS
5.0000 mg | ORAL_TABLET | Freq: Two times a day (BID) | ORAL | 1 refills | Status: DC
Start: 2023-01-18 — End: 2023-02-26

## 2023-01-18 MED ORDER — CLOTRIMAZOLE 1 % EX CREA
TOPICAL_CREAM | Freq: Two times a day (BID) | CUTANEOUS | 0 refills | Status: DC
Start: 2023-01-18 — End: 2023-06-18

## 2023-01-18 MED ORDER — FUROSEMIDE 20 MG PO TABS
20.0000 mg | ORAL_TABLET | Freq: Every morning | ORAL | 1 refills | Status: DC
Start: 2023-01-18 — End: 2023-11-09

## 2023-01-18 MED ORDER — OLMESARTAN MEDOXOMIL 40 MG PO TABS
40.0000 mg | ORAL_TABLET | Freq: Every morning | ORAL | 1 refills | Status: DC
Start: 2023-01-18 — End: 2023-05-31

## 2023-01-19 LAB — IRON PROFILE
Iron Saturation: 20 % (ref 15–55)
Iron: 64 ug/dL (ref 27–139)
TIBC: 320 ug/dL (ref 250–450)
UIBC: 256 ug/dL (ref 118–369)

## 2023-01-19 LAB — FERRITIN: Ferritin: 21 ng/mL (ref 15–150)

## 2023-01-19 LAB — VITAMIN B12: Vitamin B-12: 446 pg/mL (ref 232–1245)

## 2023-01-19 NOTE — Progress Notes (Signed)
Drishya,     Your b12, ferritin, and iron studies are normal. I am still waiting on your cbc results and will let you know as soon as I receive and review them.     Let me know if you have any questions.    Thank you,     Felisa Bonier, FNP-C

## 2023-02-09 ENCOUNTER — Telehealth (INDEPENDENT_AMBULATORY_CARE_PROVIDER_SITE_OTHER): Payer: Self-pay

## 2023-02-09 NOTE — Telephone Encounter (Signed)
T/c from pt:    She is at [facility] - ER    Her BP has been elevated for the last 2 days   200 's / 103-105    BP elevated 213/103 currently       She is telling me that the "ER Dr will not give her any meds to lower the BP , that if they did it will just go right back up"  They have done EKG - per pt it was normal  They are planning to do ? [place] / bw     I advised her that it is the [facility] responsibility to address the issue she came in w/- & if they are not able to lower the BP that they may have to admit her     She does not want to go home w/ the BP that elevated - I agreed w/ her & asked her to discuss this w/ the ER Dr    She will call me back should she cont' to have issues     She has a f/u appt set w/ Candice for Tuesday

## 2023-02-23 ENCOUNTER — Other Ambulatory Visit (INDEPENDENT_AMBULATORY_CARE_PROVIDER_SITE_OTHER): Payer: Self-pay | Admitting: Family Medicine

## 2023-02-23 DIAGNOSIS — Z86711 Personal history of pulmonary embolism: Secondary | ICD-10-CM

## 2023-05-31 ENCOUNTER — Other Ambulatory Visit (INDEPENDENT_AMBULATORY_CARE_PROVIDER_SITE_OTHER): Payer: Self-pay | Admitting: Family Medicine

## 2023-05-31 DIAGNOSIS — I10 Essential (primary) hypertension: Secondary | ICD-10-CM

## 2023-06-05 ENCOUNTER — Encounter (INDEPENDENT_AMBULATORY_CARE_PROVIDER_SITE_OTHER): Payer: Self-pay | Admitting: Family Medicine

## 2023-06-05 ENCOUNTER — Ambulatory Visit (INDEPENDENT_AMBULATORY_CARE_PROVIDER_SITE_OTHER): Payer: Medicare PPO | Admitting: Family Medicine

## 2023-06-05 VITALS — BP 120/70 | HR 84 | Temp 97.0°F | Resp 16 | Wt 267.0 lb

## 2023-06-05 DIAGNOSIS — M79672 Pain in left foot: Secondary | ICD-10-CM

## 2023-06-05 DIAGNOSIS — M25561 Pain in right knee: Secondary | ICD-10-CM

## 2023-06-05 DIAGNOSIS — R1084 Generalized abdominal pain: Secondary | ICD-10-CM

## 2023-06-05 DIAGNOSIS — K5900 Constipation, unspecified: Secondary | ICD-10-CM

## 2023-06-05 DIAGNOSIS — Z1231 Encounter for screening mammogram for malignant neoplasm of breast: Secondary | ICD-10-CM

## 2023-06-05 NOTE — Progress Notes (Signed)
 [facility] [place] [facility]                       Date of Exam: 06/05/2023 1:45 PM        Patient ID: Caitlin Malone is a 67 y.o. female.  Attending Physician: Tyrell Antonio, FNP           This note was [facility]

## 2023-06-11 ENCOUNTER — Emergency Department: Payer: Medicare PPO

## 2023-06-11 ENCOUNTER — Emergency Department
Admission: EM | Admit: 2023-06-11 | Discharge: 2023-06-11 | Disposition: A | Discharge status: Home / Self Care | Payer: Medicare PPO | Attending: Emergency Medical Services | Admitting: Emergency Medical Services

## 2023-06-11 DIAGNOSIS — I110 Hypertensive heart disease with heart failure: Secondary | ICD-10-CM | POA: Insufficient documentation

## 2023-06-11 DIAGNOSIS — Z7901 Long term (current) use of anticoagulants: Secondary | ICD-10-CM | POA: Insufficient documentation

## 2023-06-11 DIAGNOSIS — R1033 Periumbilical pain: Secondary | ICD-10-CM

## 2023-06-11 DIAGNOSIS — R1084 Generalized abdominal pain: Secondary | ICD-10-CM | POA: Insufficient documentation

## 2023-06-11 DIAGNOSIS — K921 Melena: Secondary | ICD-10-CM | POA: Insufficient documentation

## 2023-06-11 DIAGNOSIS — E669 Obesity, unspecified: Secondary | ICD-10-CM | POA: Insufficient documentation

## 2023-06-11 LAB — LAB USE ONLY - CBC WITH DIFFERENTIAL
Absolute Basophils: 0.02 10*3/uL (ref 0.00–0.08)
Absolute Eosinophils: 0.06 10*3/uL (ref 0.00–0.44)
Absolute Immature Granulocytes: 0 10*3/uL (ref 0.00–0.07)
Absolute Lymphocytes: 1.93 10*3/uL (ref 0.42–3.22)
Absolute Monocytes: 0.39 10*3/uL (ref 0.21–0.85)
Absolute Neutrophils: 1.06 10*3/uL — ABNORMAL LOW (ref 1.10–6.33)
Absolute nRBC: 0 10*3/uL (ref <=0.00)
Basophils %: 0.6 %
Eosinophils %: 1.7 %
Hematocrit: 39.4 % (ref 34.7–43.7)
Hemoglobin: 12 g/dL (ref 11.4–14.8)
Immature Granulocytes %: 0 %
Lymphocytes %: 55.8 %
MCH: 31.3 pg (ref 25.1–33.5)
MCHC: 30.5 g/dL — ABNORMAL LOW (ref 31.5–35.8)
MCV: 102.9 fL — ABNORMAL HIGH (ref 78.0–96.0)
MPV: 11 fL (ref 8.9–12.5)
Monocytes %: 11.3 %
Neutrophils %: 30.6 %
Platelet Count: 256 10*3/uL (ref 142–346)
Preliminary Absolute Neutrophil Count: 1.06 10*3/uL — ABNORMAL LOW (ref 1.10–6.33)
RBC: 3.83 10*6/uL — ABNORMAL LOW (ref 3.90–5.10)
RDW: 13 % (ref 11–15)
WBC: 3.46 10*3/uL (ref 3.10–9.50)
nRBC %: 0 /100{WBCs} (ref <=0.0)

## 2023-06-11 LAB — URINALYSIS WITH REFLEX TO MICROSCOPIC EXAM - REFLEX TO CULTURE
Urine Bilirubin: NEGATIVE
Urine Blood: NEGATIVE
Urine Glucose: NEGATIVE
Urine Ketones: NEGATIVE mg/dL
Urine Nitrite: NEGATIVE
Urine Specific Gravity: 1.024 (ref 1.001–1.035)
Urine Urobilinogen: NORMAL mg/dL (ref 0.2–2.0)
Urine pH: 7.5 (ref 5.0–8.0)

## 2023-06-11 LAB — LIPASE: Lipase: 11 U/L (ref 8–78)

## 2023-06-11 LAB — COMPREHENSIVE METABOLIC PANEL
ALT: 22 U/L (ref <=55)
AST (SGOT): 26 U/L (ref <=41)
Albumin/Globulin Ratio: 1.1 (ref 0.9–2.2)
Albumin: 3.5 g/dL (ref 3.5–5.0)
Alkaline Phosphatase: 79 U/L (ref 37–117)
Anion Gap: 3 — ABNORMAL LOW (ref 5.0–15.0)
BUN: 18 mg/dL (ref 7–21)
Bilirubin, Total: 0.4 mg/dL (ref 0.2–1.2)
CO2: 31 meq/L — ABNORMAL HIGH (ref 17–29)
Calcium: 8.8 mg/dL (ref 8.5–10.5)
Chloride: 108 meq/L (ref 99–111)
Creatinine: 0.7 mg/dL (ref 0.4–1.0)
GFR: >60 mL/min/{1.73_m2} (ref >=60.0)
Globulin: 3.2 g/dL (ref 2.0–3.6)
Glucose: 91 mg/dL (ref 70–100)
Potassium: 4.4 meq/L (ref 3.5–5.3)
Protein, Total: 6.7 g/dL (ref 6.0–8.3)
Sodium: 142 meq/L (ref 135–145)

## 2023-06-11 LAB — LAB USE ONLY - LIGHT BLUE - CITRATE HOLD TUBE

## 2023-06-11 LAB — LAB USE ONLY - LIGHT GREEN - LIHEP PST HOLD TUBE

## 2023-06-11 LAB — LAB USE ONLY - URINE GRAY CULTURE HOLD TUBE

## 2023-06-11 MED ORDER — IOHEXOL 350 MG/ML IV SOLN
100.0000 mL | Freq: Once | INTRAVENOUS | Status: AC | PRN
Start: 2023-06-11 — End: 2023-06-11
  Administered 2023-06-11: 100 mL via INTRAVENOUS

## 2023-06-11 MED ORDER — DICYCLOMINE HCL 20 MG PO TABS
20.0000 mg | ORAL_TABLET | Freq: Three times a day (TID) | ORAL | 0 refills | Status: DC | PRN
Start: 2023-06-11 — End: 2023-11-09

## 2023-06-11 MED ORDER — MORPHINE SULFATE 4 MG/ML IJ/IV SOLN (WRAP)
4.0000 mg | Freq: Once | Status: AC
Start: 2023-06-11 — End: 2023-06-11
  Administered 2023-06-11: 4 mg via INTRAVENOUS
  Filled 2023-06-11: qty 1

## 2023-06-11 MED ORDER — FAMOTIDINE 10 MG/ML IV SOLN (WRAP)
20.0000 mg | Freq: Once | INTRAVENOUS | Status: AC
Start: 2023-06-11 — End: 2023-06-11
  Administered 2023-06-11: 20 mg via INTRAVENOUS
  Filled 2023-06-11: qty 2

## 2023-06-11 NOTE — ED Triage Notes (Signed)
Pt presents to ED with abd pain x 2 weeks, seen pcp last week and was tx with pepto bismol, starting on Wednesday. Pt c/o black stool x 3 days since Saturday. Denies n/v. Denies fevers/chills. +eliquis.

## 2023-06-11 NOTE — ED Provider Notes (Signed)
 History     Chief Complaint   Patient presents with    Abdominal Pain    Melena     67 year old female, past medical history DVT on Eliquis, GERD, CHF, hypertension, OSA, comes to ED for generalized belly pain, dysuria, black stools.  Patient describ

## 2023-06-11 NOTE — ED Notes (Signed)
PA at bedside to update pt on plan of care.

## 2023-06-11 NOTE — ED Notes (Signed)
PA at bedside.

## 2023-06-11 NOTE — Discharge Instructions (Signed)
 Dear Caitlin Malone,    You were seen today by Ouida Sills, PA-C. Thank you for choosing the [facility] Emergency Department for your healthcare needs.  We hope your visit today was EXCELLENT.    Return to ER if you develop severe p

## 2023-06-18 ENCOUNTER — Ambulatory Visit (INDEPENDENT_AMBULATORY_CARE_PROVIDER_SITE_OTHER): Payer: Medicare PPO | Admitting: Family Medicine

## 2023-06-18 ENCOUNTER — Encounter (INDEPENDENT_AMBULATORY_CARE_PROVIDER_SITE_OTHER): Payer: Self-pay | Admitting: Family Medicine

## 2023-06-18 VITALS — BP 122/70 | HR 60 | Temp 98.3°F | Resp 16 | Wt 269.0 lb

## 2023-06-18 DIAGNOSIS — Z2001 Contact with and (suspected) exposure to intestinal infectious diseases due to Escherichia coli (E. coli): Secondary | ICD-10-CM

## 2023-06-18 DIAGNOSIS — R1084 Generalized abdominal pain: Secondary | ICD-10-CM

## 2023-06-18 DIAGNOSIS — R102 Pelvic and perineal pain: Secondary | ICD-10-CM

## 2023-06-18 NOTE — Progress Notes (Signed)
[facility]                       Date of Exam: 06/18/2023 10:14 AM        Patient ID: Caitlin Malone is a 67 y.o. female.  Attending Physician: Tyrell Antonio, FNP           This note was dictated using Dragon Medical One software, by [employer], a voice recognition speech to text software, and may contain inadvertent recognition errors. There may be inherent errors or limitations not intended by the user. Not all errors are caught or corrected. If there are questions or concerns about the content of this note or the information contained within the body of this dictation they should be addressed directly with the author for clarification.    Subjective:   Chief Complaint:Patient is a 67 y.o. female seen in the office today for   Chief Complaint   Patient presents with    Hospital Follow-up     Patient went to the hospital for dark stools and abd pain/cramping. The hospital prescribed dicyclomine 20mg  (not helping) stool test and ct scan done.Patient would like a MRI done to try to figure out what else is going on bd cramping has not gone away.     Patient spouse did have ecoli 1 month ago - concerns if she could have bacteria        HPI:  HPI     Patient is a 67 y.o. female seen in the office today for hospital follow-up.    Patient has a PMH of DVT on Eliquis, GERD, CHF, hypertension, OSA.  Patient was seen at [facility] emergency department on 06/11/2023 for concerns of generalized belly pain x 2 weeks, dysuria, black stools.   She was seen in the office 06/05/2023 for these concern. Had been taking Pepto when noted dark stools.    Urine studies were negative for infection  Bw was normal   Her abd ct identified the following:  -No acute inflammatory process in the abdomen or pelvis. Normal appendix.  -.Colonic diverticulosis without diverticulitis.  -She did have uterine fibroids present  -Additionally, Hypodensity in the   spleen measures 1.5 cm  previously 1.7 cm and may represent cyst or   hemangioma. Spleen is not enlarged.     Labs indicated Normal h/h.   Stool guaiac negative.   It was suspect dark stools from the pepto bismol.  She was given Dicyclomine HCl prn and sent home   Advised to follow up with pcp     Today she states her symptoms have persisted   No worse no better  Admits constant generalized discomfort over her abdomen/ mid to lower into her pelvic area  She does admit her spouse had ecoli one month ago  Denies diarrhea or blood in her stools but does admit occasionally softer stools    She does admit she has taken Ibuprofen prn with relief but pain returns- admits has not noticed relief with Dicyclomine  Associated symptoms: unknown   Aggravating factors : unknown    No vomiting, nausea, diarrhea,chest pain, SOB, blood in stools, rash, URI sx, headache, fever.  Would like to get an MRI - possible antibiotics       Problem List:  Problem List[1]    Current Medications:  Current Medications[2]    Allergies:  Allergies[3]    Immunizations:  Immunization History   Administered Date(s) Administered  COVID-19 mRNA BIVALENT vaccine 12 years and above [employer]) 30 mcg/0.3 mL 06/12/2021    COVID-19 mRNA MONOVALENT vaccine PRIMARY SERIES 12 years and above (Moderna) 100 mcg/0.5 mL 10/11/2019, 11/02/2019    COVID-19 mRNA MONOVALENT vaccine PRIMARY SERIES 12 years and above [employer]) 30 mcg/0.3 mL (DILUTE BEFORE USE) 10/11/2019, 10/23/2019, 04/01/2020, 05/04/2020    COVID-19 mRNA MONOVALENT vaccine PRIMARY SERIES 12 years and above (Pfizer) 30 mcg/0.3 mL (DO NOT DILUTE) 11/21/2020    INFLUENZA TRIVALENT HIGH DOSE PF 65 YRS AND OLDER (FLUZONE HIGH-DOSE) 07/24/2012    Influenza (Flu) vaccine 04/27/2015, 05/25/2015, 06/01/2016    Influenza vaccine quadrivalent high-dose 65 years and older (FLUZONE HIGH-DOSE) single dose 0.7 mL (PF) 04/17/2022    Influenza vaccine whole virus 06/10/2007    Influenza vaccine, quadrivalent, 6 months and older  (AFLURIA/FLUARIX/FLULAVAL/FLUZONE), single-dose preservative free, 0.5 mL 06/12/2021    Influenza vaccine, quadrivalent, 6 months and older (Afluria/Fluzone), multi-dose, 5 mL 04/23/2018, 05/24/2018    PNEUMOCOCCAL CONJUGATE 20-VALENT PF 04/17/2022    Pneumococcal conjugate vaccine, unspecified 05/24/2016    Pneumococcal polysaccharide vaccine, 23 valent 08/02/2016    Tdap (tetanus, diphtheria reduced, acellular pertussis), adsorbed vaccine 08/01/2021        Past Medical History:  Medical History[4]    Past Surgical History:  Past Surgical History[5]    Family History:  Family History[6]    Social History:  Social History[7]     The following sections were reviewed this encounter by the provider:   Tobacco  Allergies  Meds  Problems  Med Hx  Surg Hx  Fam Hx         ROS:  Review of Systems   Review of systems as noted in the HPI.    Vitals:  BP 122/70   Pulse 60   Temp 98.3 F (36.8 C)   Resp 16   Wt 122 kg (269 lb)   SpO2 95%   BMI 44.08 kg/m      Objective:     Physical Exam:  Physical Exam  Constitutional:       Appearance: Normal appearance.   Cardiovascular:      Rate and Rhythm: Normal rate.   Pulmonary:      Effort: Pulmonary effort is normal.   Abdominal:      General: There is no distension.   Psychiatric:         Mood and Affect: Mood normal.         Behavior: Behavior normal.         Thought Content: Thought content normal.         Judgment: Judgment normal.               Assessment:     1. Generalized abdominal pain  - Culture, Stool Salmonella and Shigella, Campylobacter jejuni and Campylobacter coli antigen, Shiga Toxin 1 and 2 Antigen (Order); Future  - MRI Abdomen Pelvis Wo Contrast; Future  - Culture, Stool Salmonella and Shigella, Campylobacter jejuni and Campylobacter coli antigen, Shiga Toxin 1 and 2 Antigen (Order)    2. Pelvic pain in female  - Culture, Stool Salmonella and Shigella, Campylobacter jejuni and Campylobacter coli antigen, Shiga Toxin 1 and 2 Antigen (Order);  Future  - MRI Abdomen Pelvis Wo Contrast; Future  - Culture, Stool Salmonella and Shigella, Campylobacter jejuni and Campylobacter coli antigen, Shiga Toxin 1 and 2 Antigen (Order)    3. Contact with or exposure to Escherichia coli (E. coli)  - Culture, Stool Salmonella and Shigella, Campylobacter jejuni and  Campylobacter coli antigen, Shiga Toxin 1 and 2 Antigen (Order); Future  - Culture, Stool Salmonella and Shigella, Campylobacter jejuni and Campylobacter coli antigen, Shiga Toxin 1 and 2 Antigen (Order)      Plan:     Will order imaging and stool studies  Pt advised to begin probiotics  Will notify pt of results of studies  Pt advised to follow up with GI if ss persist  List provided  Red flag ss reviewed   Pt to fu if needed     Tyrell Antonio, FNP          [1]   Patient Active Problem List  Diagnosis    Type 2 diabetes mellitus without complication    Obstructive sleep apnea syndrome    Morbid obesity    Hypertensive disorder    Encounter for weight loss counseling    Edema of lower extremity    Diabetes mellitus    Congestive heart failure    Arthritis of knee    Rheumatoid arthritis, involving unspecified site, unspecified whether rheumatoid factor present    Snoring    History of pulmonary embolism    Type 2 diabetes mellitus with stage 3a chronic kidney disease, without long-term current use of insulin    History of colon polyps    Osteopenia, unspecified location   [2]   Current Outpatient Medications   Medication Sig Dispense Refill    ACETAMINOPHEN PO Take 650 mg by mouth every 6 (six) hours as needed      albuterol sulfate HFA (PROVENTIL) 108 (90 Base) MCG/ACT inhaler Inhale 2 puffs into the lungs every 4 (four) hours as needed for Wheezing      Calcium 200 MG Tab Take 1 tablet (200 mg) by mouth every morning      carvedilol (COREG) 3.125 MG tablet Take 1 tablet (3.125 mg) by mouth Twice a day      clotrimazole (LOTRIMIN) 1 % cream Apply topically 2 (two) times daily 12 g 0    diclofenac Sodium  (VOLTAREN) 1 % Gel topical gel Apply 2 g topically 4 (four) times daily 50 g 0    dicyclomine (BENTYL) 20 MG tablet Take 1 tablet (20 mg) by mouth every 8 (eight) hours as needed (as needed for cramping) 15 tablet 0    Eliquis 5 MG TAKE ONE TABLET BY MOUTH EVERY 12 HOURS 60 tablet 4    furosemide (LASIX) 20 MG tablet Take 1 tablet (20 mg) by mouth every morning 90 tablet 1    latanoprost (XALATAN) 0.005 % ophthalmic solution Place 1 drop into both eyes nightly      Loratadine (CLARITIN PO) Take 10 mg by mouth every morning      montelukast (SINGULAIR) 10 MG tablet Take 1 tablet (10 mg) by mouth nightly      Multiple Vitamins-Minerals (multivitamin with minerals) tablet Take 1 tablet by mouth every morning      olmesartan (BENICAR) 40 MG tablet TAKE ONE TABLET BY MOUTH EVERY MORNING 90 tablet 1    timolol (TIMOPTIC) 0.5 % ophthalmic solution Place 1 drop into both eyes every morning      VITAMIN D PO Take 10,000 Units by mouth every morning       No current facility-administered medications for this visit.   [3] No Known Allergies  [4]   Past Medical History:  Diagnosis Date    Arthritis     Asthma     Bilateral cataracts     Colon polyps 2012  Congestive heart failure     controlled with meds    COVID-19 vaccine administered     Gastroesophageal reflux disease     Glaucoma     History of Chiari malformation 03/19/2019    [facility] ER, dx after fall    Hypertension     controlled on meds    Morbid obesity with BMI of 40.0-44.9, adult     Prediabetes     last A1c 5.0 04/17/22    Pulmonary embolism 2016    on Eliquis    Sleep apnea     OSA with CPAP   [5]   Past Surgical History:  Procedure Laterality Date    CATARACT EXTRACTION Right 08/17/2022    glacoma removal as well    COLONOSCOPY, DIAGNOSTIC (SCREENING) N/A 09/01/2022    Procedure: COLONOSCOPY;  Surgeon: Kenney Houseman, MD;  Location: [facility];  Service: Gastroenterology;  Laterality: N/A;    dental implants  06/16/2022    KNEE SURGERY Right 2012     TOOTH EXTRACTION  05/16/2022    upper   [6]   Family History  Problem Relation Name Age of Onset    Cancer Mother      Diabetes Mother      Arthritis Mother      Obesity Mother      Cancer Maternal Grandfather     [7]   Social History  Socioeconomic History    Marital status: Married   Tobacco Use    Smoking status: Never    Smokeless tobacco: Never   Vaping Use    Vaping status: Never Used   Substance and Sexual Activity    Alcohol use: Not Currently    Drug use: Never   Other Topics Concern    Dietary supplements / vitamins No    Anesthesia problems No    Blood thinners No    Eats large amounts No    Excessive Sweets No    Skips meals No    Eats excessive starches No    Snacks or grazes Yes    Emotional eater Yes    Eats fried food No    Eats fast food No    Diet Center No    Doylene Bode No    LA Weight Loss No    Nutri-System No    Opti-Fast / Medi-Fast No    Overeaters Anonymous No    [facility] Yes    TOPS No    Weight Watchers No    Atkins No    Binging / Purging No    Calorie Counting No    Fasting No    High Protein Yes    Low Carb Yes    Low Fat Yes    [facility] Diet No    Slim Fast No    [place] No    Stationary cycle or treadmill No    Gym/fitness Classes No    Home exercise/video No    Swimming No    Weight training No    Walking or running Yes    Hospitalization No    Hypnosis No    Physical therapy No    Psychological therapy No    Residential program No    Acutrim No    [place] No    Contrave No    Dexatrim No    Diethylpropion No    Fastin No    Fen - Phen No    Ionamin / Adipex  No    Phentermine No    Qsymia No    Prozac No    Saxenda No    Topamax No    Wellbutrin No    Xenical (Orlistat, Alli) No    Other Med No    No impairment No    Walks with cane/crutch No    Requires a wheelchair No    Bedridden No    Are you currently being treated for depression? No    Do you snore? No    Are you receiving any medical or psychological services? No    Do you ever wake up at night  gasping for breath? No    Do you have or have you been treated for an eating disorder? No    Anyone ever told you that you stop breathing while asleep? No    Do you exercise regularly? Yes    Have you or family member ever have trouble with anesthesia? No     Social Drivers of Psychologist, prison and probation services Strain: Low Risk  (09/07/2022)    Overall Financial Resource Strain (CARDIA)     Difficulty of Paying Living Expenses: Not very hard   Food Insecurity: No Food Insecurity (09/07/2022)    Hunger Vital Sign     Worried About Running Out of Food in the Last Year: Never true     Ran Out of Food in the Last Year: Never true   Transportation Needs: No Transportation Needs (09/07/2022)    PRAPARE - Therapist, art (Medical): No     Lack of Transportation (Non-Medical): No   Physical Activity: Insufficiently Active (09/07/2022)    Exercise Vital Sign     Days of Exercise per Week: 3 days     Minutes of Exercise per Session: 40 min   Stress: No Stress Concern Present (09/07/2022)    [employer] of Occupational Health - Occupational Stress Questionnaire     Feeling of Stress : Not at all   Social Connections: Socially Integrated (09/07/2022)    Social Connection and Isolation Panel [NHANES]     Frequency of Communication with Friends and Family: More than three times a week     Frequency of Social Gatherings with Friends and Family: More than three times a week     Attends Religious Services: More than 4 times per year     Active Member of [employer] or Organizations: Yes     Attends Banker Meetings: More than 4 times per year     Marital Status: Married   Catering manager Violence: Not At Risk (09/07/2022)    Humiliation, Afraid, Rape, and Kick questionnaire     Fear of Current or Ex-Partner: No     Emotionally Abused: No     Physically Abused: No     Sexually Abused: No    Received from [facility], [facility]    Housing Stability

## 2023-06-28 ENCOUNTER — Other Ambulatory Visit (INDEPENDENT_AMBULATORY_CARE_PROVIDER_SITE_OTHER): Payer: Self-pay | Admitting: Family Medicine

## 2023-07-04 LAB — STOOL, SALMONELLA AND SHIGELLA CULTURE, CAMPYLOBACTER JEJUNI AND CAMPYLOBACTER COLI ANTIGEN, SHIGA TOXIN 1 AND 2 ANTIGEN: E coli, Shiga toxin Assay: NEGATIVE

## 2023-07-04 NOTE — Progress Notes (Signed)
Deshunda,     Your stool testing was negative.  Hope you are feeling better.    Let me know if you have any questions.    Thank you,     Felisa Bonier, FNP-C

## 2023-07-16 ENCOUNTER — Other Ambulatory Visit (INDEPENDENT_AMBULATORY_CARE_PROVIDER_SITE_OTHER): Payer: Self-pay | Admitting: Family Medicine

## 2023-07-16 DIAGNOSIS — R1084 Generalized abdominal pain: Secondary | ICD-10-CM

## 2023-07-16 DIAGNOSIS — R102 Pelvic and perineal pain: Secondary | ICD-10-CM

## 2023-07-16 DIAGNOSIS — E559 Vitamin D deficiency, unspecified: Secondary | ICD-10-CM

## 2023-07-16 DIAGNOSIS — M8580 Other specified disorders of bone density and structure, unspecified site: Secondary | ICD-10-CM

## 2023-08-06 ENCOUNTER — Other Ambulatory Visit: Payer: Self-pay | Admitting: Family Medicine

## 2023-08-06 NOTE — Progress Notes (Signed)
 Eliyanah,     Your breast imaging was normal.    Let me know if you have any questions.    Thank you,     Felisa Bonier, FNP-C

## 2023-08-16 ENCOUNTER — Ambulatory Visit
Admission: RE | Admit: 2023-08-16 | Discharge: 2023-08-16 | Disposition: A | Discharge status: Home / Self Care | Payer: Medicare PPO | Source: Ambulatory Visit | Attending: Family Medicine | Admitting: Family Medicine

## 2023-08-16 DIAGNOSIS — E559 Vitamin D deficiency, unspecified: Secondary | ICD-10-CM | POA: Insufficient documentation

## 2023-08-16 DIAGNOSIS — R102 Pelvic and perineal pain: Secondary | ICD-10-CM

## 2023-08-16 DIAGNOSIS — M85831 Other specified disorders of bone density and structure, right forearm: Secondary | ICD-10-CM | POA: Insufficient documentation

## 2023-08-16 DIAGNOSIS — R1084 Generalized abdominal pain: Secondary | ICD-10-CM

## 2023-08-16 DIAGNOSIS — M8580 Other specified disorders of bone density and structure, unspecified site: Secondary | ICD-10-CM

## 2023-08-20 NOTE — Progress Notes (Signed)
 [name],     Your MRI did identify uterine fibroids.  The remainder of your exam was normal.  If you continue to experience discomfort, I would suggest following up with a gynecologist.  Please reach out to the office if you need any recommendations and/or referral.    Let me know if you have any questions.    Thank you,     Burnard Nap, FNP-C

## 2023-08-23 NOTE — Progress Notes (Signed)
 Caitlin Malone,     Your DEXA scan results indicate osteopenia as previous testing indicated.  However your results remain  stable.  For now,  I would continue to recommend taking your vitamin D  supplement as well as 1200 mg elemental calcium daily. Please try to engage in Weight bearing exercises and strength training. If applicable, keep in mind moderation of intake of alcohol, caffeine and carbonated beverages.    Let me know if you have any questions.    Thank you,     Burnard Nap, FNP-C

## 2023-11-05 ENCOUNTER — Telehealth (INDEPENDENT_AMBULATORY_CARE_PROVIDER_SITE_OTHER): Payer: Self-pay

## 2023-11-05 DIAGNOSIS — I10 Essential (primary) hypertension: Secondary | ICD-10-CM

## 2023-11-05 MED ORDER — OLMESARTAN MEDOXOMIL 40 MG PO TABS
40.0000 mg | ORAL_TABLET | Freq: Every morning | ORAL | 0 refills | Status: DC
Start: 2023-11-05 — End: 2023-11-09

## 2023-11-05 NOTE — Telephone Encounter (Signed)
 Pt called the office for refill. [place] is not in [place] - she stays half the year in [place] and the other in [place] - she is unsure if any medication was delivered - she only has a 2 pills left

## 2023-11-09 ENCOUNTER — Encounter (INDEPENDENT_AMBULATORY_CARE_PROVIDER_SITE_OTHER): Payer: Self-pay | Admitting: Family Medicine

## 2023-11-09 ENCOUNTER — Ambulatory Visit (INDEPENDENT_AMBULATORY_CARE_PROVIDER_SITE_OTHER): Admitting: Family Medicine

## 2023-11-09 VITALS — BP 130/70 | HR 73 | Temp 98.3°F | Wt 277.2 lb

## 2023-11-09 DIAGNOSIS — Z8349 Family history of other endocrine, nutritional and metabolic diseases: Secondary | ICD-10-CM

## 2023-11-09 DIAGNOSIS — E1122 Type 2 diabetes mellitus with diabetic chronic kidney disease: Secondary | ICD-10-CM

## 2023-11-09 DIAGNOSIS — R2689 Other abnormalities of gait and mobility: Secondary | ICD-10-CM

## 2023-11-09 DIAGNOSIS — I509 Heart failure, unspecified: Secondary | ICD-10-CM

## 2023-11-09 DIAGNOSIS — M79672 Pain in left foot: Secondary | ICD-10-CM

## 2023-11-09 DIAGNOSIS — M79671 Pain in right foot: Secondary | ICD-10-CM

## 2023-11-09 DIAGNOSIS — I10 Essential (primary) hypertension: Secondary | ICD-10-CM

## 2023-11-09 DIAGNOSIS — M25562 Pain in left knee: Secondary | ICD-10-CM

## 2023-11-09 DIAGNOSIS — M25551 Pain in right hip: Secondary | ICD-10-CM

## 2023-11-09 DIAGNOSIS — M1990 Unspecified osteoarthritis, unspecified site: Secondary | ICD-10-CM

## 2023-11-09 DIAGNOSIS — M25552 Pain in left hip: Secondary | ICD-10-CM

## 2023-11-09 DIAGNOSIS — Z0000 Encounter for general adult medical examination without abnormal findings: Secondary | ICD-10-CM

## 2023-11-09 DIAGNOSIS — M25561 Pain in right knee: Secondary | ICD-10-CM

## 2023-11-09 DIAGNOSIS — Z86711 Personal history of pulmonary embolism: Secondary | ICD-10-CM

## 2023-11-09 LAB — COMPREHENSIVE METABOLIC PANEL
ALT: 12 U/L (ref <=55)
AST (SGOT): 15 U/L (ref <=41)
Albumin/Globulin Ratio: 1.1 (ref 0.9–2.2)
Albumin: 3.8 g/dL (ref 3.5–5.0)
Alkaline Phosphatase: 87 U/L (ref 37–117)
Anion Gap: 6 (ref 5.0–15.0)
BUN: 14 mg/dL (ref 7–21)
Bilirubin, Total: 0.3 mg/dL (ref 0.2–1.2)
CO2: 32 meq/L — ABNORMAL HIGH (ref 17–29)
Calcium: 9.2 mg/dL (ref 8.5–10.5)
Chloride: 106 meq/L (ref 99–111)
Creatinine: 0.7 mg/dL (ref 0.4–1.0)
GFR: >60 mL/min/{1.73_m2} (ref >=60.0)
Globulin: 3.6 g/dL (ref 2.0–3.6)
Glucose: 77 mg/dL (ref 70–100)
Hemolysis Index: 3 {index}
Potassium: 4.5 meq/L (ref 3.5–5.3)
Protein, Total: 7.4 g/dL (ref 6.0–8.3)
Sodium: 144 meq/L (ref 135–145)

## 2023-11-09 LAB — LIPID PANEL
Cholesterol / HDL Ratio: 2.8 {index}
Cholesterol: 186 mg/dL (ref <=199)
HDL: 67 mg/dL (ref >=40)
LDL Calculated: 108 mg/dL — ABNORMAL HIGH (ref 0–99)
Triglycerides: 55 mg/dL (ref 34–149)
VLDL Calculated: 11 mg/dL (ref 10–40)

## 2023-11-09 LAB — HEMOGLOBIN A1C
Average Estimated Glucose: 96.8 mg/dL
Hemoglobin A1C: 5 % (ref 4.6–5.6)

## 2023-11-09 LAB — THYROID STIMULATING HORMONE (TSH) WITH REFLEX TO FREE T4: TSH: 1.64 u[IU]/mL (ref 0.35–4.94)

## 2023-11-09 MED ORDER — OLMESARTAN MEDOXOMIL 40 MG PO TABS: 40.0000 mg | ORAL_TABLET | Freq: Every morning | ORAL | 2 refills | Status: DC

## 2023-11-09 MED ORDER — ELIQUIS 5 MG PO TABS
5.0000 mg | ORAL_TABLET | Freq: Two times a day (BID) | ORAL | 1 refills | Status: DC
Start: 2023-11-09 — End: 2024-03-26

## 2023-11-09 MED ORDER — FUROSEMIDE 20 MG PO TABS
20.0000 mg | ORAL_TABLET | Freq: Every morning | ORAL | 2 refills | Status: AC
Start: 2023-11-09 — End: ?

## 2023-11-09 NOTE — Progress Notes (Signed)
 [facility] [place] [facility]                       Date of Exam: 11/09/2023 11:53 AM        Patient ID: Caitlin Malone is a 68 y.o. female.  Attending Physician: Zelia Hickory, FNP       This note was dictated using Dragon Medical One software, by [employer], a voice recognition speech to text software, and may contain inadvertent recognition errors. There may be inherent errors or limitations not intended by the user. Not all errors are caught or corrected. If there are questions or concerns about the content of this note or the information contained within the body of this dictation they should be addressed directly with the author for clarification.    Subjective:   Chief Complaint: Patient is a 68 y.o. female seen today in the office in follow-up to their chronic medical problems  Chief Complaint   Patient presents with    Medication Refill     Pt needs all medication refilled    Weight Loss     Pt would like to discuss options for weight loss. Pt is concerned about DM and parathyroid issues due to family hx.     chronic care     Pt is here today for her chronic care visit. Pt needs kidney panel done for insurance purposes.        HPI:  HPI   Patient is a 68 y.o. female seen today in the office in follow-up to their chronic medical problems  Patient has a past medical history of htn, diabetes type 2, CHF, elevated cholesterol, osteopenia and, PE, and OSA,  and CKD     Htn/chf/ elevated cholesterol   Pt taking lasix  20 mg once daily (previously twice daily dosing), olmesartan  40 mg daily and carvedilol 3.125mg  bid.   Bp well controlled on regimen   She has been working hard on weight loss.  However, admits she continues to struggle with losing weight.  She states she eats the least amount out of people in her house but continues to gain the most. She is interested in starting something like a starch blocker. She wonders if she has any issues with her thyroid . Admits a  family history of thyroid  conditions.   Lab Results   Component Value Date    CHOL 195 04/17/2022    CHOL 190 11/04/2020     Lab Results   Component Value Date    HDL 67 04/17/2022    HDL 69 11/04/2020     Lab Results   Component Value Date    LDL 115 (H) 04/17/2022    LDL 108 (H) 11/04/2020     Lab Results   Component Value Date    TRIG 64 04/17/2022    TRIG 63 11/04/2020      Diabetes  Diet controlled  Follows with eye doctor regularly  Lab Results   Component Value Date    HGBA1C 5.0 04/17/2022      Ckd  Has been stable/ wnl     Has been evaluated for abdominal/ pelvic pain  MRI revealed fibroids. No additional findings. Was advised to f/u with gyn       AWV: will get today   Immunizations: due for shingles , covid booster  , will come back     Admits her skin has been itchy- has not been taking her allergy medication  Pt also admits increased difficulty with mobility.  She does have a history of arthritis.  She has been doing some exercises at home but admits she continues to experience pain and decreased mobility.  Is interested in therapy..     Arthritis   Problem List:  Problem List[1]    Current Medications:  Current Medications[2]    Allergies:  Allergies[3]    Immunizations:  Immunization History   Administered Date(s) Administered    COVID-19 mRNA BIVALENT vaccine 12 years and above [employer]) 30 mcg/0.3 mL 06/12/2021    COVID-19 mRNA MONOVALENT vaccine PRIMARY SERIES 12 years and above (Moderna) 100 mcg/0.5 mL 10/11/2019, 11/02/2019    COVID-19 mRNA MONOVALENT vaccine PRIMARY SERIES 12 years and above [employer]) 30 mcg/0.3 mL (DILUTE BEFORE USE) 10/11/2019, 10/23/2019, 04/01/2020, 05/04/2020    COVID-19 mRNA MONOVALENT vaccine PRIMARY SERIES 12 years and above (Pfizer) 30 mcg/0.3 mL (DO NOT DILUTE) 11/21/2020    INFLUENZA TRIVALENT HIGH DOSE PF 65 YRS AND OLDER (FLUZONE HIGH-DOSE) 07/24/2012    Influenza (Flu) vaccine 04/27/2015, 05/25/2015, 06/01/2016    Influenza vaccine quadrivalent high-dose 65 years  and older (FLUZONE HIGH-DOSE) single dose 0.7 mL (PF) 04/17/2022    Influenza vaccine whole virus 06/10/2007    Influenza vaccine, quadrivalent, 6 months and older (AFLURIA/FLUARIX/FLULAVAL/FLUZONE), single-dose preservative free, 0.5 mL 06/12/2021    Influenza vaccine, quadrivalent, 6 months and older (Afluria/Fluzone), multi-dose, 5 mL 04/23/2018, 05/24/2018    PNEUMOCOCCAL CONJUGATE 20-VALENT PF 04/17/2022    Pneumococcal conjugate vaccine, unspecified 05/24/2016    Pneumococcal polysaccharide vaccine, 23 valent 08/02/2016    Tdap (tetanus, diphtheria reduced, acellular pertussis), adsorbed vaccine 08/01/2021        Past Medical History:  Medical History[4]    Past Surgical History:  Past Surgical History[5]    Family History:  Family History[6]    Social History:  Social History[7]     The following sections were reviewed this encounter by the provider:        ROS:  Review of Systems   Review of systems as noted in the HPI.    Vitals:  BP 130/70   Pulse 73   Temp 98.3 F (36.8 C)   Wt 125.7 kg (277 lb 3.2 oz)   SpO2 94%   BMI 45.43 kg/m      Objective:     Physical Exam:  Physical Exam   Constitutional:      No apparent distress   Cardiovascular:      Rate and Rhythm: Normal rate and regular rhythm. Normal S1 and S2, no murmur     Pulses: Normal pulses.   Pulmonary:      Effort: Pulmonary effort is normal.      Breath sounds: Normal breath sounds     Lungs: Clear to auscultation bilaterally without wheezing or rhonchi  Psychiatric:         Mood and Affect: Mood normal.         Behavior: Behavior normal.         Thought Content: Thought content normal.         Judgment: Judgment normal.        Assessment:     1. Hypertension, unspecified type  - Lipid Panel; Future  - Lipid Panel    2. Congestive heart failure, unspecified HF chronicity, unspecified heart failure type (CMS/HCC)  - Lipid Panel; Future  - Lipid Panel    3. Type 2 diabetes mellitus with diabetic chronic kidney disease, unspecified CKD stage,  unspecified whether long  term insulin use (CMS/HCC)  - Hemoglobin A1C; Future  - Lipid Panel; Future  - Comprehensive Metabolic Panel; Future  - Urine Microalbumin, Random; Future  - Hemoglobin A1C  - Lipid Panel  - Comprehensive Metabolic Panel  - Urine Microalbumin, Random    4. Family history of thyroid  disease  - Thyroid  Stimulating Hormone (TSH) with Reflex to Free T4; Future  - Thyroid  Stimulating Hormone (TSH) with Reflex to Free T4    5. Decreased mobility  - Referral to Physical Therapy - EXTERNAL; Future    6. Bilateral hip pain  - Referral to Physical Therapy - EXTERNAL; Future    7. Pain in both knees, unspecified chronicity  - Referral to Physical Therapy - EXTERNAL; Future    8. Foot pain, bilateral  - Referral to Physical Therapy - EXTERNAL; Future    9. Osteoarthritis, unspecified osteoarthritis type, unspecified site  - Referral to Physical Therapy - EXTERNAL; Future    10. Encounter for annual wellness visit (AWV) in Medicare patient      Plan:     1. Hypertension, unspecified type/2. Congestive heart failure, unspecified HF chronicity, unspecified heart failure type (CMS/HCC)  - Lipid Panel; Future  - Lipid Panel  Continue medication as prescribed.  Follow-up in 6 months sooner if needed    3. Type 2 diabetes mellitus with diabetic chronic kidney disease, unspecified CKD stage, unspecified whether long term insulin use (CMS/HCC)  - Hemoglobin A1C; Future  - Lipid Panel; Future  - Comprehensive Metabolic Panel; Future  - Urine Microalbumin, Random; Future  - Hemoglobin A1C  - Lipid Panel  - Comprehensive Metabolic Panel  - Urine Microalbumin, Random  Continue regular exercise and a healthy diet  Fu and recommendations based on results  4. Family history of thyroid  disease  - Thyroid  Stimulating Hormone (TSH) with Reflex to Free T4; Future  - Thyroid  Stimulating Hormone (TSH) with Reflex to Free T4    5. Decreased mobility  6. Bilateral hip pain  7. Pain in both knees, unspecified chronicity  8.  Foot pain, bilateral  9. Osteoarthritis, unspecified osteoarthritis type, unspecified site  - Referral to Physical Therapy - EXTERNAL; Future    10. Encounter for annual wellness visit (AWV) in Medicare patient  Revived and agree with note above, fu 1 year.   Zelia Hickory, FNP          [1]   Patient Active Problem List  Diagnosis    Type 2 diabetes mellitus without complication (CMS/HCC)    Obstructive sleep apnea syndrome    Morbid obesity (CMS/HCC)    Hypertensive disorder    Encounter for weight loss counseling    Edema of lower extremity    Diabetes mellitus (CMS/HCC)    Congestive heart failure (CMS/HCC)    Arthritis of knee    Rheumatoid arthritis, involving unspecified site, unspecified whether rheumatoid factor present (CMS/HCC)    Snoring    History of pulmonary embolism    Type 2 diabetes mellitus with stage 3a chronic kidney disease, without long-term current use of insulin (CMS/HCC)    History of colon polyps    Osteopenia, unspecified location   [2]   Current Outpatient Medications   Medication Sig Dispense Refill    ACETAMINOPHEN PO Take 650 mg by mouth every 6 (six) hours as needed      albuterol sulfate HFA (PROVENTIL) 108 (90 Base) MCG/ACT inhaler Inhale 2 puffs into the lungs every 4 (four) hours as needed for Wheezing  carvedilol (COREG) 3.125 MG tablet Take 1 tablet (3.125 mg) by mouth Twice a day      diclofenac  Sodium (VOLTAREN ) 1 % Gel topical gel Apply 2 g topically 4 (four) times daily 50 g 0    Eliquis  5 MG TAKE ONE TABLET BY MOUTH EVERY 12 HOURS 60 tablet 4    furosemide  (LASIX ) 20 MG tablet Take 1 tablet (20 mg) by mouth every morning 90 tablet 1    latanoprost (XALATAN) 0.005 % ophthalmic solution Place 1 drop into both eyes nightly      Loratadine (CLARITIN PO) Take 10 mg by mouth every morning      montelukast (SINGULAIR) 10 MG tablet Take 1 tablet (10 mg) by mouth nightly      Multiple Vitamins-Minerals (multivitamin with minerals) tablet Take 1 tablet by mouth every morning       olmesartan  (BENICAR ) 40 MG tablet Take 1 tablet (40 mg) by mouth every morning 30 tablet 0    timolol (TIMOPTIC) 0.5 % ophthalmic solution Place 1 drop into both eyes every morning      VITAMIN D  PO Take 10,000 Units by mouth every morning       No current facility-administered medications for this visit.   [3] No Known Allergies  [4]   Past Medical History:  Diagnosis Date    Arthritis     Asthma     Bilateral cataracts     Colon polyps 2012    Congestive heart failure (CMS/HCC)     controlled with meds    COVID-19 vaccine administered     Gastroesophageal reflux disease     Glaucoma     History of Chiari malformation 03/19/2019    [facility], dx after fall    Hypertension     controlled on meds    Morbid obesity with BMI of 40.0-44.9, adult (CMS/HCC)     Prediabetes     last A1c 5.0 04/17/22    Pulmonary embolism (CMS/HCC) 2016    on Eliquis     Sleep apnea     OSA with CPAP   [5]   Past Surgical History:  Procedure Laterality Date    CATARACT EXTRACTION Right 08/17/2022    glacoma removal as well    COLONOSCOPY, DIAGNOSTIC (SCREENING) N/A 09/01/2022    Procedure: COLONOSCOPY;  Surgeon: Armon Landry, MD;  Location: [facility];  Service: Gastroenterology;  Laterality: N/A;    dental implants  06/16/2022    KNEE SURGERY Right 2012    TOOTH EXTRACTION  05/16/2022    upper   [6]   Family History  Problem Relation Name Age of Onset    Cancer Mother      Diabetes Mother      Arthritis Mother      Obesity Mother      Cancer Maternal Grandfather     [7]   Social History  Socioeconomic History    Marital status: Married   Tobacco Use    Smoking status: Never    Smokeless tobacco: Never   Vaping Use    Vaping status: Never Used   Substance and Sexual Activity    Alcohol use: Not Currently    Drug use: Never   Other Topics Concern    Dietary supplements / vitamins No    Anesthesia problems No    Blood thinners No    Eats large amounts No    Excessive Sweets No    Skips meals No    Eats excessive  starches No     Snacks or grazes Yes    Emotional eater Yes    Eats fried food No    Eats fast food No    Diet Center No    Delight Felts No    [facility] Weight Loss No    Nutri-System No    Opti-Fast / Medi-Fast No    Overeaters Anonymous No    [facility] Yes    TOPS No    Weight Watchers No    Atkins No    Binging / Purging No    Calorie Counting No    Fasting No    High Protein Yes    Low Carb Yes    Low Fat Yes    [facility] Diet No    Slim Fast No    [place] No    Stationary cycle or treadmill No    Gym/fitness Classes No    Home exercise/video No    Swimming No    Weight training No    Walking or running Yes    Hospitalization No    Hypnosis No    Physical therapy No    Psychological therapy No    Residential program No    Acutrim No    [place] No    Contrave No    Dexatrim No    Diethylpropion No    Fastin No    Fen - Phen No    Ionamin / Adipex No    Phentermine No    Qsymia No    Prozac No    Saxenda No    Topamax No    Wellbutrin No    Xenical (Orlistat, Alli) No    Other Med No    No impairment No    Walks with cane/crutch No    Requires a wheelchair No    Bedridden No    Are you currently being treated for depression? No    Do you snore? No    Are you receiving any medical or psychological services? No    Do you ever wake up at night gasping for breath? No    Do you have or have you been treated for an eating disorder? No    Anyone ever told you that you stop breathing while asleep? No    Do you exercise regularly? Yes    Have you or family member ever have trouble with anesthesia? No     Social Drivers of Psychologist, prison and probation services Strain: Low Risk  (09/07/2022)    Overall Financial Resource Strain (CARDIA)     Difficulty of Paying Living Expenses: Not very hard   Food Insecurity: No Food Insecurity (09/07/2022)    Hunger Vital Sign     Worried About Running Out of Food in the Last Year: Never true     Ran Out of Food in the Last Year: Never true   Transportation Needs: No Transportation Needs  (09/07/2022)    PRAPARE - Therapist, art (Medical): No     Lack of Transportation (Non-Medical): No   Physical Activity: Insufficiently Active (09/07/2022)    Exercise Vital Sign     Days of Exercise per Week: 3 days     Minutes of Exercise per Session: 40 min   Stress: No Stress Concern Present (09/07/2022)    [employer] of Occupational Health - Occupational Stress Questionnaire     Feeling of Stress : Not  at all   Social Connections: Socially Integrated (09/07/2022)    Social Connection and Isolation Panel [NHANES]     Frequency of Communication with Friends and Family: More than three times a week     Frequency of Social Gatherings with Friends and Family: More than three times a week     Attends Religious Services: More than 4 times per year     Active Member of [employer] or Organizations: Yes     Attends Banker Meetings: More than 4 times per year     Marital Status: Married   Catering manager Violence: Not At Risk (09/07/2022)    Humiliation, Afraid, Rape, and Kick questionnaire     Fear of Current or Ex-Partner: No     Emotionally Abused: No     Physically Abused: No     Sexually Abused: No    Received from [facility]    Housing Stability

## 2023-11-09 NOTE — Progress Notes (Signed)
 [facility]   Medicare Wellness Visit                 Caitlin Malone is a 68 y.o. female who presents today for the following Medicare Wellness Visit:  []  Initial Preventive Physical Exam (IPPE) - "Welcome to Medicare" preventive visit (Vision Screening required)   []  Annual Wellness Visit - Initial  [x]  Annual Wellness Visit - Subsequent                                                                                                                                                 Health Risk Assessment   During the past month, how would you rate your general health?:  Good  Which of the following tasks can you do without assistance - drive or take the bus alone; shop for groceries or clothes; prepare your own meals; do your own housework/laundry; handle your own finances/pay bills; eat, bathe or get around your home?: Drive or take the bus alone, Shop for groceries or clothes, Prepare your own meals, Do your own housework/laundry, Handle your own finances/pay bills, Eat, bathe, dress or get around your home  Which of the following problems have you been bothered by in the past month - dizzy when standing up; problems using the phone; feeling tired or fatigued; moderate or severe body pain?: None of these  Do you exercise for about 20 minutes 3 or more days per week?:Yes (walk/stretches)  During the past month was someone available to help if you needed and wanted help?  For example, if you felt nervous, lonely, got sick and had to stay in bed, needed someone to talk to, needed help with daily chores or needed help just taking care of yourself.: Yes (spouse - wilbur - working on AMD  1 son(out of country) 2 daughters( live local))  Do you always wear a seat belt?: Yes  Do you have any trouble taking medications the way you have been told to take them?: No  Have you been given any information that can help you with keeping track of your medications?: Yes  Do you have trouble paying for your  medications?: No  Have you been given any information that can help you with hazards in your house, such as scatter rugs, furniture, etc?: Yes  Do you feel unsteady when standing or walking?: No  Do you worry about falling?: No  Have you fallen two or more times in the past year?: No  Did you suffer any injuries from your falls in the past year?: No    Hospitalizations   Hospitalization within past year: [x]  No  []  Yes     Diagnosis:    Screenings         04/17/2022 06/05/2023 11/09/2023   Ambulatory Screenings   Falls Risk: Alfonse Angle more than 2 times in past  year N N N   Falls Risk: Suffer any injuries? N N N   Depression: PHQ2 Total Score 0   0   Depression: PHQ9 Total Score 0  0       Data saved with a previous flowsheet row definition        Substance Use Disorder Screen:  In the past year, how often have you used the following?  1) Alcohol (For men, 5 or more drinks a day. For women, 4 or more drinks a day)  [x]  Never []  Once or Twice []  Monthly []  Weekly []  Daily or Almost Daily  2) Tobacco Products  [x]  Never []  Once or Twice []  Monthly []  Weekly []  Daily or Almost Daily  3) Prescription Drugs for Non-Medical Reasons  [x]  Never []  Once or Twice []  Monthly []  Weekly []  Daily or Almost Daily  4) Illegal Drugs  [x]  Never []  Once or Twice []  Monthly []  Weekly []  Daily or Almost Daily            Functional Ability/Level of Safety   Falls Risk/Home Safety Assessment:  (see HRA and Screenings sections for additional assessment)    Home Safety:   []  Low Risk for Falls  [x]  Skid-resistant rugs/remove throw rugs   [x]  Grab bars    [x]  Clear pathways between rooms    [x]  Proper lighting stairs/ bathrooms/bedrooms    Get Up and Go (optional):    []   <20 secs    []   >20 secs    []   High risk for falls - Home Safety/Falls Risk Precautions reviewed with pt/family    Hearing Assessment:  Concerns for hearing loss: []  Yes  [x]   No  Hearing aids:   []   Right  []   Left  []   Bilateral   []   None  Whisper Test (optional):  []  Normal   []   Slightly decreased  []   Significantly decreased    Visual Acuity:  Examination  Last Eye Exam :  (2025 - Q 6 mths)  If no visual acuity exam above:  [] Patient Declines  [x]  Up to date with Optometry/Ophthalmology per patient report.    Exercise:  Frequency:  []   No formal exercise  []   1-2x/wk  [x]   3-4x/wk  []   >4x/wk  Duration:  []   15-30 mins/day  [x]   30-45 mins/day  []   45+ mins/day  Intensity:  [x]   Light  []   Moderate  []   Heavy    Diet:  Diet: - consumes a well balanced diet  - compliant with heart healthy and well balanced diet and vegetarian diet      Activities of Daily Living     ADL's Independent Minimal  Assistance Moderate  Assistance Total   Assistance   Bathing [x]  []  []  []    Dressing [x]  []  []  []    Mobility   [x]  []  []  []    Transfer [x]  []  []  []    Eating [x]  []  []  []    Toileting [x]  []  []  []      IADL's Independent Minimal  Assistance Moderate  Assistance Total   Assistance   Phone [x]  []  []  []    Housekeeping [x]  []  []  []    Laundry [x]  []  []  []    Transportation [x]  []  []  []    Medications [x]  []  []  []    Finances [x]  []  []  []       ADL assistance:   [x]  No assistance needed    []  Spouse    []  Sibling    []   Son   []  Daughter   []  Children    []  Home Health Aide   []  Other:    Social Activities/Engagement   Frequency of Communication with Friends and Family:  []  Never []  Rarely []  Sometimes []  Often[x]  Very often    Frequency of Social Gatherings with Friends and Family:   []  Never []  Rarely []  Sometimes []  Often[x]  Very often    Advanced Care Planning   Discussion of Advance Directives:   []  Advance Directive in chart    []  Advance Directive not in chart - requested to provide   []  No Advance Directive.  Form Provided    []  No Advance Directive.  Pt declines.   []  Not addressed today    [x]  Other: Currently working on completing the AMD         Exam   BP 130/70   Pulse 73   Temp 98.3 F (36.8 C)   Wt 125.7 kg (277 lb 3.2 oz)   SpO2 94%   BMI 45.43 kg/m   Physical Exam    Patient was not able  to leave ua sample today - she will be out of town x 2 weeks, will call for lab appt upon return  She is to repeat her colonoscopy in 5 yrs  ( 2029 )        Evaluation of Cognitive Function   Mood/affect: [x]  Appropriate  []   Other:   Appearance: [x]  Neatly groomed  [x]  Adequately nourished  []  Other:  Family member/caregiver input: []  Present - no concerns  [x]   Not present in room  []  Present - concerns:    Cognitive Assessment:  Mini-Cog Result (three word registration- banana, sunrise, chair / clock drawing):   [x]   > 3 points - negative screen for dementia   []  3 recalled words - negative screen for dementia   []  1-2 recalled words and normal clock draw - negative for cognitive impairment   []  1-2 recalled words and abnormal clock draw - positive for cognitive impairment   []  0 recalled words - positive for cognitive impairment         Personalized Prevention Plan   The patient was provided with a personalized prevention plan via   the Patient Instructions tab of this visit                                                                                                                                                 Assessment/Plan     Assessment & Plan  Hypertension, unspecified type    Orders:    Lipid Panel; Future    olmesartan  (BENICAR ) 40 MG tablet; Take 1 tablet (40 mg) by mouth every morning    Congestive heart failure, unspecified HF chronicity, unspecified heart failure type (CMS/HCC)    Orders:    Lipid Panel; Future  Type 2 diabetes mellitus with diabetic chronic kidney disease, unspecified CKD stage, unspecified whether long term insulin use (CMS/HCC)    Orders:    Hemoglobin A1C; Future    Lipid Panel; Future    Comprehensive Metabolic Panel; Future    Urine Microalbumin, Random; Future    Family history of thyroid  disease    Orders:    Thyroid  Stimulating Hormone (TSH) with Reflex to Free T4; Future    Decreased mobility    Orders:    Referral to Physical Therapy - EXTERNAL; Future    Bilateral  hip pain    Orders:    Referral to Physical Therapy - EXTERNAL; Future    Pain in both knees, unspecified chronicity    Orders:    Referral to Physical Therapy - EXTERNAL; Future    Foot pain, bilateral    Orders:    Referral to Physical Therapy - EXTERNAL; Future    Osteoarthritis, unspecified osteoarthritis type, unspecified site    Orders:    Referral to Physical Therapy - EXTERNAL; Future    Encounter for annual wellness visit (AWV) in Medicare patient         History of pulmonary embolism    Orders:    Eliquis  5 MG; Take 1 tablet (5 mg) by mouth every 12 (twelve) hours    Other congestive heart failure (CMS/HCC)    Orders:    furosemide  (LASIX ) 20 MG tablet; Take 1 tablet (20 mg) by mouth every morning          History/Care Team   Patient Care Team:  Zelia Hickory, FNP as PCP - General (Family Nurse Practitioner)  Bonni Butter, MD as Consulting Physician (Pulmonary Disease)  Lee, Junsoo A, MD as Consulting Physician (Cardiology)  Gayle Kava, MD as Consulting Physician (Neurology)  Armon Landry, MD as Consulting Physician (Gastroenterology)  Almarie Arias, [place] as Physician Assistant (Orthopaedic Surgery)  [x]  Medical, surgical, family history reviewed and updated during this encounter  [x]  Medication list updated and reconciled during this encounter    Additional Documentation

## 2023-11-09 NOTE — Assessment & Plan Note (Signed)
 Orders:    Lipid Panel; Future    olmesartan  (BENICAR ) 40 MG tablet; Take 1 tablet (40 mg) by mouth every morning

## 2023-11-09 NOTE — Assessment & Plan Note (Signed)
 Orders:    Hemoglobin A1C; Future    Lipid Panel; Future    Comprehensive Metabolic Panel; Future    Urine Microalbumin, Random; Future

## 2023-11-09 NOTE — Assessment & Plan Note (Signed)
 Orders:    Eliquis  5 MG; Take 1 tablet (5 mg) by mouth every 12 (twelve) hours

## 2023-11-09 NOTE — Patient Instructions (Signed)
 MEDICARE WELLNESS PERSONAL PREVENTION PLAN   As part of the Medicare Wellness portion of your visit today, we are providing you with this personalized preventative plan of care. The list below includes many common screening recommendations from the USPSTF ([place]  Preventive Services Task Force) but is not meant to be comprehensive. You may be eligible for other preventative services depending upon your personal risk factors.     Health Maintenance   Topic Date Due   . Statin Use  Never done   . Advance Directive on File  Never done   . Shingrix Vaccine 50+ (1) Never done   . COVID-19 Vaccine (9 - 2024-25 season) 03/25/2023   . OPHTHALMOLOGY EXAM  04/06/2023   . HEMOGLOBIN A1C ANNUAL  04/18/2023   . URINE MICROALBUMIN  04/18/2023   . INFLUENZA VACCINE  02/22/2024   . FALLS RISK ANNUAL  11/08/2024   . DEPRESSION SCREENING  11/08/2024   . Medicare Annual Wellness Visit  11/08/2024   . Mammogram  08/05/2025   . Tetanus Ten-Year  08/02/2031   . Colorectal Cancer Screening  09/01/2032   . HEPATITIS C SCREENING  Completed   . DXA Scan  Completed   . Pneumonia Vaccine Age 13 Years and Older  Completed   . Cervical Cancer Screening Five Years  Discontinued     Health Maintenance Topics with due status: Overdue       Topic Date Due    Statin Use Never done    Advance Directive on File Never done    Shingrix Vaccine 50+ Never done    COVID-19 Vaccine 03/25/2023    OPHTHALMOLOGY EXAM 04/06/2023    HEMOGLOBIN A1C ANNUAL 04/18/2023    URINE MICROALBUMIN 04/18/2023      Immunization History   Administered Date(s) Administered   . COVID-19 mRNA BIVALENT vaccine 12 years and above [employer]) 30 mcg/0.3 mL 06/12/2021   . COVID-19 mRNA MONOVALENT vaccine PRIMARY SERIES 12 years and above (Moderna) 100 mcg/0.5 mL 10/11/2019, 11/02/2019   . COVID-19 mRNA MONOVALENT vaccine PRIMARY SERIES 12 years and above [employer]) 30 mcg/0.3 mL (DILUTE BEFORE USE) 10/11/2019, 10/23/2019, 04/01/2020, 05/04/2020   . COVID-19 mRNA MONOVALENT vaccine  PRIMARY SERIES 12 years and above (Pfizer) 30 mcg/0.3 mL (DO NOT DILUTE) 11/21/2020   . INFLUENZA TRIVALENT HIGH DOSE PF 65 YRS AND OLDER (FLUZONE HIGH-DOSE) 07/24/2012   . INFLUENZA TRIVALENT PF SD 6 MO AND OLDER (FLUARIX/FLULAVAL) 04/02/2023   . Influenza (Flu) vaccine 04/27/2015, 05/25/2015, 06/01/2016   . Influenza vaccine quadrivalent high-dose 65 years and older (FLUZONE HIGH-DOSE) single dose 0.7 mL (PF) 04/17/2022   . Influenza vaccine whole virus 06/10/2007   . Influenza vaccine, quadrivalent, 6 months and older (AFLURIA/FLUARIX/FLULAVAL/FLUZONE), single-dose preservative free, 0.5 mL 06/12/2021   . Influenza vaccine, quadrivalent, 6 months and older (Afluria/Fluzone), multi-dose, 5 mL 04/23/2018, 05/24/2018   . PNEUMOCOCCAL CONJUGATE 20-VALENT PF 04/17/2022   . Pneumococcal conjugate vaccine, unspecified 05/24/2016   . Pneumococcal polysaccharide vaccine, 23 valent 08/02/2016   . Tdap (tetanus, diphtheria reduced, acellular pertussis), adsorbed vaccine 08/01/2021        Your major risk factors:   Recommendations for improvement:      Colorectal Cancer Screening - All adults 45-75 yrs should undergo periodic colorectal cancer screening. The decision to screen for colorectal cancer in adults aged 67 to 91 years should be an individual one, taking into account your overall health and prior screening history.   Breast Cancer Screening - Women aged 40-1yrs should have mammograms every other  year (please note that this recommendation may not be appropriate for every woman - your physician can answer specific questions you may have). The USPSTF concludes that the current evidence is insufficient to assess the balance of benefits and harms of screening mammography in women aged 52 years or older.  These recommendations do not apply to persons who have a genetic marker or syndrome associated with a high risk of breast cancer (eg, BRCA1 or BRCA2 genetic variation), a history of high-dose radiation therapy to the  chest at a young age, or previous breast cancer or a high-risk breast lesion on previous biopsies.   Cervical Cancer Screening - Women over 34 do not require pap smears as long as prior screening has been normal and are not otherwise at high risk for cervical cancer. The USPSTF recommends screening for cervical cancer every 3 years with cervical cytology alone in women aged 88 to 54 years. For women aged 48 to 53 years, the USPSTF recommends screening every 3 years with cervical cytology alone, every 5 years with high-risk human papillomavirus (hrHPV) testing alone, or every 5 years with hrHPV testing in combination with cytology (cotesting).   Osteoporosis Screening -  The USPSTF recommends screening for osteoporosis with bone measurement testing to prevent osteoporotic fractures in women 65 years and older.  For postmenopausal women younger than 65 years who are at increased risk of osteoporosis, as determined by a formal clinical risk assessment tool, the USPSTF recommends screening for osteoporosis with bone measurement testing to prevent osteoporotic fractures.   Hepatitis C Screening - Recommend screening for hepatitis C virus (HCV) infection in all adults aged 63 to 67 years.  Lung cancer Screening - Recommend annual screening for lung cancer with low-dose computed tomography (LDCT) in adults ages 53 to 59 years who have a 20 pack-year smoking history and currently smoke or have quit within the past 15 years.  Recommended Vaccinations from the CDC (U.S.  Centers for Disease Control and Prevention)   Influenza one dose annually   COVID vaccine - stay current with the most up-to-date COVID update/booster  Tetanus/diphtheria (Tdap) one booster every 10 years   Zoster/Shingles - (Shingrix) two doses after age 71 (second dose given 2-6 months after first dose)  Pneumococcal 20-valent or 21-valent conjugate vaccine - one dose for adults aged >=65 years with no history of prior pneumococcal  vaccination.  Pneumococcal 20-valent or 21-valent conjugate vaccine -  one dose for adults aged >=65 years with PPSV23 only or PCV13 only given more than 1 yr ago.  Pneumococcal 20-valent or 21-valent conjugate vaccine - one dose for adults aged >=65 years who have completed PCV-13 and PPSV23 after 67yrs old and it has been greater than 68yrs (shared clinical decision-making is recommended regarding administration of this vaccine).   RSV (Respiratory Syncytial Virus) vaccine for everyone ages 20 and older  RSV (Respiratory Syncytial Virus)  vaccine ages 61-74 who are at increased risk of severe RSV disease (for example, chronic heart/lung/liver/kidney disease, immunocompromised)    PERSONAL PREVENTION PLAN   Your Personal Prevention Plan is based on your overall health and your responses to the health questionnaire you completed. The following information is for you to review in addition to the recommendations, referrals, and tests we have discussed at your visit.     Physical Activity:   Physical activity can help you maintain a healthy weight, prevent or control illness, reduce stress, and sleep better. It can also help you improve your balance to avoid falls. Try  to build up to and maintain a total of 30 minutes of activity each day. If you are able, try walking, doing yard or housework, and taking the stairs more often. You can also strengthen your muscles with exercises done while sitting or lying down. Web resource - https://www.carpenter-henry.info/  Emotional Health:   Feeling "down in the dumps" or anxious every now and then is a natural part of life. If this feeling lasts for a few weeks or more, talk with me as soon as possible. It could be a sign of a problem that needs treatment. There are many types of treatment available. Web resource -  RXPreview.de  Falls:   You can reduce your risk of falling by making changes in your home. Remove items that may cause tripping,  improve lighting, and consider installing grab bars.   Talk with me if you have problems with balance and walking. To prevent falls, you may need your vision, hearing, or blood pressure checked. Exercises to improve your strength and balance, or using a cane or walker, may help. Review your medicines with me at every visit, because some can affect balance. Please be sure to let me know if you fall or are fearful you may fall. Web resource - BounceThru.fi  Urinary Leakage:   Urine leakage is common, but it is not a normal part of aging. Talk with me about any urine leakage so that the cause can be found and treated. Treatment can include bladder training, exercises, medicine or surgery.   Pain:   We all have aches and pains at times, but chronic pain can change how you feel and live every day. Please talk with me about any symptoms of chronic pain so that we can determine how best to treat.   Sleep:   Getting a good night's sleep is vital to your health and well-being and can help prevent or manage health problems. Often, sleep can be improved by changing behaviors, including when you go to bed and what you do before bed. Sleep apnea can cause problems such as struggling to stay awake during the day. Please let me know if you would like to learn more about improving your sleep and/or think you may have sleep apnea. Web resource -  ThousandQuestions.com.cy  Seat Belt:   Please remember to wear a seat belt when driving or riding in a vehicle. It is one of the most important things you can do to stay safe in a car.   Nutrition:   Remember to eat plenty of fruits, vegetables, whole grains, and dairy. Drink at least 64 ounces (8 full glasses) of water a day, unless you have been advised to limit fluids.  Web resource- PickSeat.dk  Alcohol:   Alcohol can have a greater effect on older people, who may feel its effects at a lower  amount. Older people should limit alcoholic drinks (no more than one a day for women and no more than two a day for men). Please let me know if alcohol use becomes a problem.  Web resource - AgingMortgage.ca  Tobacco:   Not smoking or using other forms of tobacco is one of the most important things you can do for your health. Here is some more information about the importance of quitting smoking and how to quit smoking - Mudlogger - BroadJournal.com.pt  Advance Directives:   There may come a time when medical decisions need to be made on your behalf. Please talk with your family, and with me,  about your wishes. It is important to provide information about your decisions, and any formal advance directives, for your medical record. Here is additional information on advanced directives - Web resource - MediaExhibitions.no  Additional Support:   Sometimes it can be challenging to manage all aspects of daily life. Finding the right support can help you maintain or improve your health and independence. Please let me know if you would like to talk further about finding resources to assist you.

## 2023-11-09 NOTE — Assessment & Plan Note (Signed)
 Orders:    furosemide (LASIX) 20 MG tablet; Take 1 tablet (20 mg) by mouth every morning

## 2023-11-09 NOTE — Addendum Note (Signed)
 Addended by: Ziare Cryder J. on: 11/09/2023 01:57 PM     Modules accepted: Orders

## 2023-11-09 NOTE — Assessment & Plan Note (Signed)
 Orders:    Lipid Panel; Future

## 2023-11-12 ENCOUNTER — Ambulatory Visit (INDEPENDENT_AMBULATORY_CARE_PROVIDER_SITE_OTHER): Payer: Self-pay | Admitting: Family Medicine

## 2023-11-12 NOTE — Progress Notes (Signed)
 Caitlin Malone,     Your A1c is normal    Your cholesterol has improved from 1 year ago, so that is great!  Please continue with regular exercise and healthy diet.  For now would recommend continuing to monitor    Normal kidney and liver functions.  Normal electrolytes.    Your thyroid  levels are normal    Your A1c is normal.      Let me know if you have any questions.    Thank you,     Chaya Cord, FNP-C

## 2023-12-05 ENCOUNTER — Other Ambulatory Visit

## 2024-02-02 ENCOUNTER — Other Ambulatory Visit (INDEPENDENT_AMBULATORY_CARE_PROVIDER_SITE_OTHER): Payer: Self-pay | Admitting: Family Medicine

## 2024-02-02 DIAGNOSIS — I509 Heart failure, unspecified: Secondary | ICD-10-CM

## 2024-03-03 ENCOUNTER — Telehealth (INDEPENDENT_AMBULATORY_CARE_PROVIDER_SITE_OTHER): Payer: Self-pay

## 2024-03-03 NOTE — Telephone Encounter (Signed)
 T/c from pt  Her BP has been running high since Friday   Was elevated at the Dentist office - can't recall the #  She lm on her cardiologist vc mail just after that visit h/e they just called her back this am   BP over the weekend was iin the high range - ( she is out of the house right now can't recall #)  H/e this am the BP was 190/120    She did hear back from cardiology - she gave them her BP readings - they will see her tomorrow @ 1:30 pm   Dr Junsoo Lee    Confirmed her BP meds:  Olmesartan  40 mg daily  Amlodipine - she thinks 5 mg     Was taken off Coreg d/t regulated BP - #'s were low so she was taken off of that med    She denies - CP,. H/a - no n/v - no radiating numbness/tingling of extremities - no vertigo    Will send Burnard the [facility] - cardiology did not make any rec on med change or addt'l dosing -  Red flag s/sx discussed on when to seek ER care  She v/u & agrees w/ plan    I will contact her w/ any rec from [facility]  '

## 2024-03-07 ENCOUNTER — Ambulatory Visit (INDEPENDENT_AMBULATORY_CARE_PROVIDER_SITE_OTHER): Admitting: Family Medicine

## 2024-03-07 NOTE — Progress Notes (Deleted)
 [facility]                       Date of Exam: 03/07/2024 9:26 AM        Patient ID: Caitlin Malone is a 68 y.o. female.  Attending Physician: Burnard JONETTA Nap, FNP           Please pardon any potential grammatical errors or typos as aspects of this note may be have been created through speech-to-text software. There may be inherent errors or limitations not intended by the user. Not all errors are caught or corrected. If there are questions or concerns about the content of this note or the information contained within the body of this dictation they should be addressed directly with the author for clarification.    Subjective:   Chief Complaint:Patient is a 67 y.o. female seen in the office today for No chief complaint on file.      HPI:  HPI     Patient is a 68 y.o. female seen in the office today for concerns of elevated blood pressure.  Patient has a past medical history of htn, diabetes type 2, CHF, elevated cholesterol, osteopenia and, PE, and OSA,  and CKD     Patient does follow with cardiologist regularly.  She was seen by cardiologist 03/04/2024***  Currently taking olmesartan  and amlodipine as well as Lasix   Had previously been taking Coreg  but had been discontinued due to lower readings.    Has been monitoring blood pressure at home****averaging****  Denies chest pain palpitations dizziness headache blurred vision.    She does have a history of CKD.      Problem List:  Problem List[1]    Current Medications:  Current Medications[2]    Allergies:  Allergies[3]    Immunizations:  Immunization History   Administered Date(s) Administered    COVID-19 mRNA BIVALENT vaccine 12 years and above [employer]) 30 mcg/0.3 mL 06/12/2021    COVID-19 mRNA MONOVALENT vaccine PRIMARY SERIES 12 years and above (Moderna) 100 mcg/0.5 mL 10/11/2019, 11/02/2019    COVID-19 mRNA MONOVALENT vaccine PRIMARY SERIES 12 years and above [employer]) 30 mcg/0.3 mL (DILUTE BEFORE USE) 10/11/2019,  10/23/2019, 04/01/2020, 05/04/2020    COVID-19 mRNA MONOVALENT vaccine PRIMARY SERIES 12 years and above [employer]) 30 mcg/0.3 mL (DO NOT DILUTE) 11/21/2020    INFLUENZA TRIVALENT PF SD 6 MO AND OLDER (FLUARIX/FLULAVAL) 04/02/2023    Influenza (Flu) vaccine 04/27/2015, 05/25/2015, 06/01/2016    Influenza quadrivalent (AFLURIA/FLUARIX/FLULAVAL/FLUZONE), 6 months and older, 0.5 mL, preservative free 06/12/2021    Influenza quadrivalent (AFLURIA/FLUZONE), 6 months and older, multi-dose, 5 mL 04/23/2018, 05/24/2018    Influenza quadrivalent high-dose (FLUZONE HIGH-DOSE) 65 years and older, 0.7 mL, preservative free 04/17/2022    Influenza trivalent high-dose (FLUZONE HIGH-DOSE), 65 years and older, preservative free 07/24/2012    Influenza vaccine whole virus 06/10/2007    Pneumococcal conjugate (PREVNAR 20) 20-valent, preservative free 04/17/2022    Pneumococcal conjugate vaccine, unspecified 05/24/2016    Pneumococcal polysaccharide (PNEUMOVAX 23), 23-valent 08/02/2016    Tdap (tetanus, diphtheria reduced, acellular pertussis) (ADACEL/BOOSTRIX), adsorbed 08/01/2021        Past Medical History:  Medical History[4]    Past Surgical History:  Past Surgical History[5]    Family History:  Family History[6]    Social History:  Social History[7]     The following sections were reviewed this encounter by the provider:        ROS:  Review of Systems  Review of systems as noted in the HPI.    Vitals:  There were no vitals taken for this visit.     Objective:     Physical Exam:  Physical Exam   Constitutional:      No apparent distress   Cardiovascular:      Rate and Rhythm: Normal rate and regular rhythm. Normal S1 and S2, no murmur     Pulses: Normal pulses.   Pulmonary:      Effort: Pulmonary effort is normal.      Breath sounds: Normal breath sounds     Lungs: Clear to auscultation bilaterally without wheezing or rhonchi  Abdominal exam: nondistended.  Extremities: No edema  Psychiatric:         Mood and Affect: Mood normal.          Behavior: Behavior normal.         Thought Content: Thought content normal.         Judgment: Judgment normal.        Assessment:     There are no diagnoses linked to this encounter.    Plan:       Burnard JONETTA Nap, FNP        [1]   Patient Active Problem List  Diagnosis    Type 2 diabetes mellitus without complication (CMS/HCC)    Obstructive sleep apnea syndrome    Morbid obesity (CMS/HCC)    Hypertensive disorder    Encounter for weight loss counseling    Edema of lower extremity    Diabetes mellitus (CMS/HCC)    Congestive heart failure (CMS/HCC)    Arthritis of knee    Rheumatoid arthritis, involving unspecified site, unspecified whether rheumatoid factor present (CMS/HCC)    Snoring    History of pulmonary embolism    Type 2 diabetes mellitus with stage 3a chronic kidney disease, without long-term current use of insulin (CMS/HCC)    History of colon polyps    Osteopenia, unspecified location   [2]   Current Outpatient Medications   Medication Sig Dispense Refill    ACETAMINOPHEN PO Take 650 mg by mouth every 6 (six) hours as needed      albuterol sulfate HFA (PROVENTIL) 108 (90 Base) MCG/ACT inhaler Inhale 2 puffs into the lungs every 4 (four) hours as needed for Wheezing      diclofenac  Sodium (VOLTAREN ) 1 % Gel topical gel Apply 2 g topically 4 (four) times daily 50 g 0    Eliquis  5 MG Take 1 tablet (5 mg) by mouth every 12 (twelve) hours 180 tablet 1    furosemide  (LASIX ) 20 MG tablet Take 1 tablet (20 mg) by mouth every morning 90 tablet 2    latanoprost (XALATAN) 0.005 % ophthalmic solution Place 1 drop into both eyes nightly      Loratadine (CLARITIN PO) Take 10 mg by mouth every morning      montelukast (SINGULAIR) 10 MG tablet Take 1 tablet (10 mg) by mouth nightly      Multiple Vitamins-Minerals (multivitamin with minerals) tablet Take 1 tablet by mouth every morning      olmesartan  (BENICAR ) 40 MG tablet Take 1 tablet (40 mg) by mouth every morning 90 tablet 2    timolol (TIMOPTIC) 0.5 %  ophthalmic solution Place 1 drop into both eyes every morning      VITAMIN D  PO Take 10,000 Units by mouth every morning       No current facility-administered medications for this visit.   [3]  No Known Allergies  [4]   Past Medical History:  Diagnosis Date    Arthritis     Asthma     Bilateral cataracts     Colon polyps 2012    Congestive heart failure (CMS/HCC)     controlled with meds    COVID-19 vaccine administered     Gastroesophageal reflux disease     Glaucoma     History of Chiari malformation 03/19/2019    [facility], dx after fall    Hypertension     controlled on meds    Morbid obesity with BMI of 40.0-44.9, adult (CMS/HCC)     Prediabetes     last A1c 5.0 04/17/22    Pulmonary embolism (CMS/HCC) 2016    on Eliquis     Sleep apnea     OSA with CPAP   [5]   Past Surgical History:  Procedure Laterality Date    CATARACT EXTRACTION Bilateral 08/17/2022    glacoma removal as well    COLONOSCOPY, DIAGNOSTIC (SCREENING) N/A 09/01/2022    Procedure: COLONOSCOPY;  Surgeon: Jama Claretta DEL, MD;  Location: [facility];  Service: Gastroenterology;  Laterality: N/A;    dental implants  06/16/2022    KNEE SURGERY Right 2012    TOOTH EXTRACTION  05/16/2022    upper   [6]   Family History  Problem Relation Name Age of Onset    Cancer Mother      Diabetes Mother      Arthritis Mother      Obesity Mother      Cancer Maternal Grandfather     [7]   Social History  Socioeconomic History    Marital status: Married     Spouse name: wilbur    Number of children: 3   Tobacco Use    Smoking status: Never    Smokeless tobacco: Never   Vaping Use    Vaping status: Never Used   Substance and Sexual Activity    Alcohol use: Not Currently    Drug use: Never   Other Topics Concern    Dietary supplements / vitamins No    Anesthesia problems No    Blood thinners No    Eats large amounts No    Excessive Sweets No    Skips meals No    Eats excessive starches No    Snacks or grazes Yes    Emotional eater Yes    Eats fried food No     Eats fast food No    Diet Center No    [employer] No    [facility] Weight [facility] No    Nutri-System No    Opti-Fast / Medi-Fast No    Overeaters Anonymous No    [facility] Yes    TOPS No    Weight Watchers No    Atkins No    Binging / Purging No    Calorie Counting No    Fasting No    High Protein Yes    Low Carb Yes    Low Fat Yes    [facility] Diet No    Slim Fast No    [place] No    Stationary cycle or treadmill No    Gym/fitness Classes No    Home exercise/video No    Swimming No    Weight training No    Walking or running Yes    Hospitalization No    Hypnosis No    Physical therapy No  Psychological therapy No    Residential program No    Acutrim No    [place] No    Contrave No    Dexatrim No    Diethylpropion No    Fastin No    Fen - Phen No    Ionamin / Adipex No    Phentermine No    Qsymia No    Prozac No    Saxenda No    Topamax No    Wellbutrin No    Xenical (Orlistat, Alli) No    Other Med No    No impairment No    Walks with cane/crutch No    Requires a wheelchair No    Bedridden No    Are you currently being treated for depression? No    Do you snore? No    Are you receiving any medical or psychological services? No    Do you ever wake up at night gasping for breath? No    Do you have or have you been treated for an eating disorder? No    Anyone ever told you that you stop breathing while asleep? No    Do you exercise regularly? Yes    Have you or family member ever have trouble with anesthesia? 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     Social Drivers of Health     Financial Resource Strain: Low Risk  (09/07/2022)    Overall Financial Resource Strain (CARDIA)     Difficulty of Paying Living Expenses: Not very hard   Food Insecurity: No Food Insecurity (09/07/2022)    Hunger Vital Sign     Worried About Running Out of Food in the Last Year: Never true     Ran Out of Food in the Last Year: Never true   Transportation Needs: No  Transportation Needs (09/07/2022)    PRAPARE - Therapist, art (Medical): No     Lack of Transportation (Non-Medical): No   Physical Activity: Insufficiently Active (09/07/2022)    Exercise Vital Sign     Days of Exercise per Week: 3 days     Minutes of Exercise per Session: 40 min   Stress: No Stress Concern Present (09/07/2022)    [employer] of Occupational Health - Occupational Stress Questionnaire     Feeling of Stress : Not at all   Social Connections: Socially Integrated (09/07/2022)    Social Connection and Isolation Panel     Frequency of Communication with Friends and Family: More than three times a week     Frequency of Social Gatherings with Friends and Family: More than three times a week     Attends Religious Services: More than 4 times per year     Active Member of [employer] or Organizations: Yes     Attends Banker Meetings: More than 4 times per year     Marital Status: Married   Catering manager Violence: Not At Risk (09/07/2022)    Humiliation, Afraid, Rape, and Kick questionnaire     Fear of Current or Ex-Partner: No     Emotionally Abused: No     Physically Abused: No     Sexually Abused: No    Received from [facility]    Housing Stability

## 2024-03-12 ENCOUNTER — Encounter (INDEPENDENT_AMBULATORY_CARE_PROVIDER_SITE_OTHER): Payer: Self-pay | Admitting: Family Medicine

## 2024-03-12 ENCOUNTER — Ambulatory Visit (INDEPENDENT_AMBULATORY_CARE_PROVIDER_SITE_OTHER): Admitting: Family Medicine

## 2024-03-12 VITALS — BP 141/85 | HR 70 | Temp 98.1°F | Resp 16 | Wt 285.0 lb

## 2024-03-12 DIAGNOSIS — I10 Essential (primary) hypertension: Secondary | ICD-10-CM

## 2024-03-12 DIAGNOSIS — E119 Type 2 diabetes mellitus without complications: Secondary | ICD-10-CM

## 2024-03-12 DIAGNOSIS — I509 Heart failure, unspecified: Secondary | ICD-10-CM

## 2024-03-12 DIAGNOSIS — M25569 Pain in unspecified knee: Secondary | ICD-10-CM

## 2024-03-12 DIAGNOSIS — G4733 Obstructive sleep apnea (adult) (pediatric): Secondary | ICD-10-CM

## 2024-03-12 MED ORDER — CARVEDILOL 12.5 MG PO TABS
12.5000 mg | ORAL_TABLET | Freq: Two times a day (BID) | ORAL | 1 refills | Status: DC
Start: 2024-03-12 — End: 2024-03-20

## 2024-03-12 MED ORDER — OZEMPIC (0.25 OR 0.5 MG/DOSE) 2 MG/3ML SC SOPN
0.2500 mg | PEN_INJECTOR | SUBCUTANEOUS | 0 refills | Status: DC
Start: 2024-03-12 — End: 2024-04-10

## 2024-03-12 NOTE — Progress Notes (Signed)
 [facility]                       Date of Exam: 03/12/2024 4:12 PM        Patient ID: Caitlin Malone is a 68 y.o. female.  Attending Physician: Burnard JONETTA Nap, FNP           Please pardon any potential grammatical errors or typos as aspects of this note may be have been created through speech-to-text software. There may be inherent errors or limitations not intended by the user. Not all errors are caught or corrected. If there are questions or concerns about the content of this note or the information contained within the body of this dictation they should be addressed directly with the author for clarification.    Subjective:   Chief Complaint:Patient is a 68 y.o. female seen in the office today for   Chief Complaint   Patient presents with    Hypertension     BP check- patient stated her numbers are slightly lower then it was when she called in. Patient notices that her weight is fluctuating back and forth and its affecting her bp every time. Would like to maintain this for health help and to fix her bp       HPI:  HPI     Patient is a 68 y.o. female seen in the office today for concerns of elevated blood pressure.  Patient has a past medical history of htn, diabetes type 2, CHF, elevated cholesterol, osteopenia and, PE, and OSA,  and CKD     Patient does follow with cardiologist regularly.  She was seen by cardiologist 03/04/2024-   Currently taking olmesartan  and amlodipine as well as Lasix   Had previously been taking Coreg  but had been discontinued due to lower readings.  She got placed back on carvededol -has been monitoring her blood pressure at home which has been improving some however continues to be elevated.  She has been getting readings more in the 160s.    Denies chest pain palpitations dizziness headache blurred vision.  She does get intermittent lower leg swelling however admits usually her Lasix  does allow her lower leg swelling to resolve.    She admits  she continues to be in significant discomfort in her knees.  She had been getting steroid injections and used pain patches in the past.  She admits that the injections have stopped working.  However her pain has been significant and she has been unable to be as active and walk/exercise regularly.  She has gained weight back which she feels is correlated to her blood pressure going up.  She has also recently been taking Advil every day due to the pain in her knees.    Patient also is wondering how she can help lose weight.  She has been trying to reduce her caloric intake but continues to struggle with her weight.  She admits has been difficult since she has not been able to be as active due to her knee pain.  She has been to weight loss clinics in the past.  Denies hx pancreatitis, personal or family hx of medullary thyroid  cancer or MEN2.  Problem List:  Problem List[1]    Current Medications:  Current Medications[2]    Allergies:  Allergies[3]    Immunizations:  Immunization History   Administered Date(s) Administered    COVID-19 mRNA BIVALENT vaccine 12 years and above [employer]) 30 mcg/0.3 mL 06/12/2021  COVID-19 mRNA MONOVALENT vaccine PRIMARY SERIES 12 years and above (Moderna) 100 mcg/0.5 mL 10/11/2019, 11/02/2019    COVID-19 mRNA MONOVALENT vaccine PRIMARY SERIES 12 years and above [employer]) 30 mcg/0.3 mL (DILUTE BEFORE USE) 10/11/2019, 10/23/2019, 04/01/2020, 05/04/2020    COVID-19 mRNA MONOVALENT vaccine PRIMARY SERIES 12 years and above [employer]) 30 mcg/0.3 mL (DO NOT DILUTE) 11/21/2020    INFLUENZA TRIVALENT PF SD 6 MO AND OLDER (FLUARIX/FLULAVAL) 04/02/2023    Influenza (Flu) vaccine 04/27/2015, 05/25/2015, 06/01/2016    Influenza quadrivalent (AFLURIA/FLUARIX/FLULAVAL/FLUZONE), 6 months and older, 0.5 mL, preservative free 06/12/2021    Influenza quadrivalent (AFLURIA/FLUZONE), 6 months and older, multi-dose, 5 mL 04/23/2018, 05/24/2018    Influenza quadrivalent high-dose (FLUZONE HIGH-DOSE) 65 years and  older, 0.7 mL, preservative free 04/17/2022    Influenza trivalent high-dose (FLUZONE HIGH-DOSE), 65 years and older, preservative free 07/24/2012    Influenza vaccine whole virus 06/10/2007    Pneumococcal conjugate (PREVNAR 20) 20-valent, preservative free 04/17/2022    Pneumococcal conjugate vaccine, unspecified 05/24/2016    Pneumococcal polysaccharide (PNEUMOVAX 23), 23-valent 08/02/2016    Tdap (tetanus, diphtheria reduced, acellular pertussis) (ADACEL/BOOSTRIX), adsorbed 08/01/2021        Past Medical History:  Medical History[4]    Past Surgical History:  Past Surgical History[5]    Family History:  Family History[6]    Social History:  Social History[7]     The following sections were reviewed this encounter by the provider:        ROS:  Review of Systems   Review of systems as noted in the HPI.    Vitals:  BP 141/85 (BP Site: Left arm, Patient Position: Sitting) Comment: forearm (cuff too small) --- patient cuff 166/99 (L)  Pulse 70   Temp 98.1 F (36.7 C)   Resp 16   Wt 129.3 kg (285 lb)   SpO2 96%   BMI 46.71 kg/m      Objective:     Physical Exam:  Physical Exam   Constitutional:      No apparent distress   Cardiovascular:      Rate and Rhythm: Normal rate  Pulmonary:      Effort: Pulmonary effort is normal.   Abdominal exam: nondistended.  Extremities: No edema  Psychiatric:         Mood and Affect: Mood normal.         Behavior: Behavior normal.         Thought Content: Thought content normal.         Judgment: Judgment normal.        Assessment:     1. Other congestive heart failure (CMS/HCC)  - semaglutide  (Ozempic , 0.25 or 0.5 MG/DOSE,) 2 MG/3ML; Inject 0.25 mg into the skin once a week  Dispense: 1.5 mL; Refill: 0    2. Hypertension, unspecified type  - carvedilol  (COREG ) 12.5 MG tablet; Take 1 tablet (12.5 mg) by mouth 2 (two) times daily  Dispense: 180 tablet; Refill: 1  - semaglutide  (Ozempic , 0.25 or 0.5 MG/DOSE,) 2 MG/3ML; Inject 0.25 mg into the skin once a week  Dispense: 1.5 mL;  Refill: 0    3. Type 2 diabetes mellitus without complication, without long-term current use of insulin (CMS/HCC)  - semaglutide  (Ozempic , 0.25 or 0.5 MG/DOSE,) 2 MG/3ML; Inject 0.25 mg into the skin once a week  Dispense: 1.5 mL; Refill: 0    4. Obstructive sleep apnea syndrome  - semaglutide  (Ozempic , 0.25 or 0.5 MG/DOSE,) 2 MG/3ML; Inject 0.25 mg into the skin once  a week  Dispense: 1.5 mL; Refill: 0    5. Knee pain, unspecified chronicity, unspecified laterality      Plan:   1. Other congestive heart failure (CMS/HCC)  2. Hypertension, unspecified type  - carvedilol  (COREG ) 12.5 MG tablet; Take 1 tablet (12.5 mg) by mouth 2 (two) times daily  Dispense: 180 tablet; Refill: 1  - semaglutide  (Ozempic , 0.25 or 0.5 MG/DOSE,) 2 MG/3ML; Inject 0.25 mg into the skin once a week  Dispense: 1.5 mL; Refill: 0  Patient to increase carvedilol  to 12.5 mg twice daily.  Continue with olmesartan  40 mg daily and amlodipine.  Continue to monitor blood pressures.  Patient advised to get a new blood pressure cuff as her cuff seems to be getting fairly higher readings that are in office cuff.  Red flag symptoms reviewed  Continue to follow with cardiologist indicated  Patient to follow-up in 1 week, sooner if needed  5. Knee pain, unspecified chronicity, unspecified laterality  Patient advised to follow-up with [facility]  for further evaluation.  Discussed discontinue ibuprofen.  Patient may use Tylenol and over-the-counter topical remedies along with heat and ice if needed for pain.  Patient to follow-up if needed  1. Other congestive heart failure (CMS/HCC)  2. Hypertension, unspecified type  3. Type 2 diabetes mellitus without complication, without long-term current use of insulin (CMS/HCC)  4. Obstructive sleep apnea syndrome  - semaglutide  (Ozempic , 0.25 or 0.5 MG/DOSE,) 2 MG/3ML; Inject 0.25 mg into the skin once a week  Dispense: 1.5 mL; Refill: 0  Discussed use of medications to help with weight loss.  Patient to start  Ozempic  0.25 mg subcutaneous injection  Discussed medications, admin, side effects, care, and red flag sxs  Pt will take as prescribed  Care and red flag sxs reviewed  Discussed side effects, particularly nausea d/t delayed gastric emptying, possibility of pancreatitis. Denies hx pancreatitis, personal or family hx of medullary thyroid  cancer or MEN2.  Patient to engage in regular exercise and healthy diet  Patient follow-up in 1 week if medication approved to discuss,  sooner if needed      Burnard JONETTA Nap, FNP          [1]   Patient Active Problem List  Diagnosis    Type 2 diabetes mellitus without complication (CMS/HCC)    Obstructive sleep apnea syndrome    Morbid obesity (CMS/HCC)    Hypertensive disorder    Encounter for weight loss counseling    Edema of lower extremity    Diabetes mellitus (CMS/HCC)    Congestive heart failure (CMS/HCC)    Arthritis of knee    Rheumatoid arthritis, involving unspecified site, unspecified whether rheumatoid factor present (CMS/HCC)    Snoring    History of pulmonary embolism    Type 2 diabetes mellitus with stage 3a chronic kidney disease, without long-term current use of insulin (CMS/HCC)    History of colon polyps    Osteopenia, unspecified location   [2]   Current Outpatient Medications   Medication Sig Dispense Refill    ACETAMINOPHEN PO Take 650 mg by mouth every 6 (six) hours as needed      albuterol sulfate HFA (PROVENTIL) 108 (90 Base) MCG/ACT inhaler Inhale 2 puffs into the lungs every 4 (four) hours as needed for Wheezing      amLODIPine (NORVASC) 10 MG tablet 1 tablet (10 mg)      diclofenac  Sodium (VOLTAREN ) 1 % Gel topical gel Apply 2 g topically 4 (four) times daily 50 g  0    Eliquis  5 MG Take 1 tablet (5 mg) by mouth every 12 (twelve) hours 180 tablet 1    furosemide  (LASIX ) 20 MG tablet Take 1 tablet (20 mg) by mouth every morning 90 tablet 2    latanoprost (XALATAN) 0.005 % ophthalmic solution Place 1 drop into both eyes nightly      Loratadine (CLARITIN  PO) Take 10 mg by mouth every morning      montelukast (SINGULAIR) 10 MG tablet Take 1 tablet (10 mg) by mouth nightly      Multiple Vitamins-Minerals (multivitamin with minerals) tablet Take 1 tablet by mouth every morning      olmesartan  (BENICAR ) 40 MG tablet Take 1 tablet (40 mg) by mouth every morning 90 tablet 2    timolol (TIMOPTIC) 0.5 % ophthalmic solution Place 1 drop into both eyes every morning      VITAMIN D  PO Take 10,000 Units by mouth every morning      carvedilol  (COREG ) 12.5 MG tablet Take 1 tablet (12.5 mg) by mouth 2 (two) times daily 180 tablet 1    semaglutide  (Ozempic , 0.25 or 0.5 MG/DOSE,) 2 MG/3ML Inject 0.25 mg into the skin once a week 1.5 mL 0     No current facility-administered medications for this visit.   [3]   Allergies  Allergen Reactions    Meloxicam Anaphylaxis   [4]   Past Medical History:  Diagnosis Date    Arthritis     Asthma     Bilateral cataracts     Colon polyps 2012    Congestive heart failure (CMS/HCC)     controlled with meds    COVID-19 vaccine administered     Gastroesophageal reflux disease     Glaucoma     History of Chiari malformation 03/19/2019    [facility], dx after fall    Hypertension     controlled on meds    Morbid obesity with BMI of 40.0-44.9, adult (CMS/HCC)     Prediabetes     last A1c 5.0 04/17/22    Pulmonary embolism (CMS/HCC) 2016    on Eliquis     Sleep apnea     OSA with CPAP   [5]   Past Surgical History:  Procedure Laterality Date    CATARACT EXTRACTION Bilateral 08/17/2022    glacoma removal as well    COLONOSCOPY, DIAGNOSTIC (SCREENING) N/A 09/01/2022    Procedure: COLONOSCOPY;  Surgeon: Jama Claretta DEL, MD;  Location: [facility];  Service: Gastroenterology;  Laterality: N/A;    dental implants  06/16/2022    KNEE SURGERY Right 2012    TOOTH EXTRACTION  05/16/2022    upper   [6]   Family History  Problem Relation Name Age of Onset    Cancer Mother      Diabetes Mother      Arthritis Mother      Obesity Mother      Cancer Maternal  Grandfather     [7]   Social History  Socioeconomic History    Marital status: Married     Spouse name: wilbur    Number of children: 3   Tobacco Use    Smoking status: Never    Smokeless tobacco: Never   Vaping Use    Vaping status: Never Used   Substance and Sexual Activity    Alcohol use: Not Currently    Drug use: Never   Other Topics Concern    Dietary supplements / vitamins No  Anesthesia problems No    Blood thinners No    Eats large amounts No    Excessive Sweets No    Skips meals No    Eats excessive starches No    Snacks or grazes Yes    Emotional eater Yes    Eats fried food No    Eats fast food No    Diet Center No    [place] No    [facility] Weight Loss No    Nutri-System No    Opti-Fast / Medi-Fast No    Overeaters Anonymous No    [facility] Yes    TOPS No    Weight Watchers No    Atkins No    Binging / Purging No    Calorie Counting No    Fasting No    High Protein Yes    Low Carb Yes    Low Fat Yes    [facility] Diet No    Slim Fast No    [place] No    Stationary cycle or treadmill No    Gym/fitness Classes No    Home exercise/video No    Swimming No    Weight training No    Walking or running Yes    Hospitalization No    Hypnosis No    Physical therapy No    Psychological therapy No    Residential program No    Acutrim No    [place] No    Contrave No    Dexatrim No    Diethylpropion No    Fastin No    Fen - Phen No    Ionamin / Adipex No    Phentermine No    Qsymia No    Prozac No    Saxenda No    Topamax No    Wellbutrin No    Xenical (Orlistat, Alli) No    Other Med No    No impairment No    Walks with cane/crutch No    Requires a wheelchair No    Bedridden No    Are you currently being treated for depression? No    Do you snore? No    Are you receiving any medical or psychological services? No    Do you ever wake up at night gasping for breath? No    Do you have or have you been treated for an eating disorder? No    Anyone ever told you that you stop breathing while asleep?  No    Do you exercise regularly? Yes    Have you or family member ever have trouble with anesthesia? 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     Social Drivers of Health     Financial Resource Strain: Low Risk  (09/07/2022)    Overall Financial Resource Strain (CARDIA)     Difficulty of Paying Living Expenses: Not very hard   Food Insecurity: No Food Insecurity (09/07/2022)    Hunger Vital Sign     Worried About Running Out of Food in the Last Year: Never true     Ran Out of Food in the Last Year: Never true   Transportation Needs: No Transportation Needs (09/07/2022)    PRAPARE - Therapist, art (Medical): No     Lack of Transportation (Non-Medical): No   Physical Activity: Insufficiently Active (09/07/2022)    Exercise Vital Sign  Days of Exercise per Week: 3 days     Minutes of Exercise per Session: 40 min   Stress: No Stress Concern Present (09/07/2022)    [employer] of Occupational Health - Occupational Stress Questionnaire     Feeling of Stress : Not at all   Social Connections: Socially Integrated (09/07/2022)    Social Connection and Isolation Panel     Frequency of Communication with Friends and Family: More than three times a week     Frequency of Social Gatherings with Friends and Family: More than three times a week     Attends Religious Services: More than 4 times per year     Active Member of [employer] or Organizations: Yes     Attends Banker Meetings: More than 4 times per year     Marital Status: Married   Catering manager Violence: Not At Risk (09/07/2022)    Humiliation, Afraid, Rape, and Kick questionnaire     Fear of Current or Ex-Partner: No     Emotionally Abused: No     Physically Abused: No     Sexually Abused: No    Received from [facility]    Housing Stability

## 2024-03-19 ENCOUNTER — Encounter (INDEPENDENT_AMBULATORY_CARE_PROVIDER_SITE_OTHER): Payer: Self-pay | Admitting: Family Medicine

## 2024-03-19 NOTE — Progress Notes (Unsigned)
 [facility]                       Date of Exam: 03/20/2024 1:56 PM        Patient ID: Caitlin Malone is a 68 y.o. female.  Attending Physician: Burnard JONETTA Nap, FNP         Patient is seen by a virtual visit today, with real time audio and video call. Patient gives verbal consent to continue with the telemedicine visit. Patient lives in [place]  and is currently at home. Identity was verified by an existing patient relationship. I educated patient on the virtual visit platform and shared my credentials. I informed the patient that if the virtual visit was interrupted, I would call back on their home/cell phone.    Please pardon any potential grammatical errors or typos as aspects of this note may be have been created through speech-to-text software. There may be inherent errors or limitations not intended by the user. Not all errors are caught or corrected. If there are questions or concerns about the content of this note or the information contained within the body of this dictation they should be addressed directly with the author for clarification.    Subjective:   Chief Complaint:  No chief complaint on file.      HPI:  HPI     Patient is a 68 y.o. female who presents today for a vv for follow-up.    Patient was seen 03/12/2024 in the office for concerns of elevated blood pressure.  Her carvedilol  had been increased from 12.5 mg daily to twice daily.  She also continues with olmesartan  amlodipine.  Her blood pressure cuff seems to be inaccurate in the office.  Patient admits she did get a new blood pressure cuff  ****  Admits average blood pressures**    Patient does follow regularly with cardiologist for management of cardiac conditions.    Patient also was started on medication to help with weight loss    Patient currently taking semaglutide  0.25 mg   Pt has been taking medication for *** weeks  Denies any significant side effects from medication  Patient currently has  lost about****pounds  Patient has been continuing to work on following a healthy diet and regular exercise  Denies hx pancreatitis, personal or family hx of medullary thyroid  cancer or MEN2.      Problem List:  Problem List[1]    Current Medications:  Current Medications[2]    Allergies:  Allergies[3]    Immunizations:  Immunization History   Administered Date(s) Administered    COVID-19 mRNA BIVALENT vaccine 12 years and above [employer]) 30 mcg/0.3 mL 06/12/2021    COVID-19 mRNA MONOVALENT vaccine PRIMARY SERIES 12 years and above (Moderna) 100 mcg/0.5 mL 10/11/2019, 11/02/2019    COVID-19 mRNA MONOVALENT vaccine PRIMARY SERIES 12 years and above [employer]) 30 mcg/0.3 mL (DILUTE BEFORE USE) 10/11/2019, 10/23/2019, 04/01/2020, 05/04/2020    COVID-19 mRNA MONOVALENT vaccine PRIMARY SERIES 12 years and above [employer]) 30 mcg/0.3 mL (DO NOT DILUTE) 11/21/2020    INFLUENZA TRIVALENT PF SD 6 MO AND OLDER (FLUARIX/FLULAVAL) 04/02/2023    Influenza (Flu) vaccine 04/27/2015, 05/25/2015, 06/01/2016    Influenza quadrivalent (AFLURIA/FLUARIX/FLULAVAL/FLUZONE), 6 months and older, 0.5 mL, preservative free 06/12/2021    Influenza quadrivalent (AFLURIA/FLUZONE), 6 months and older, multi-dose, 5 mL 04/23/2018, 05/24/2018    Influenza quadrivalent high-dose (FLUZONE HIGH-DOSE) 65 years and older, 0.7 mL, preservative free 04/17/2022    Influenza  trivalent high-dose (FLUZONE HIGH-DOSE), 65 years and older, preservative free 07/24/2012    Influenza vaccine whole virus 06/10/2007    Pneumococcal conjugate (PREVNAR 20) 20-valent, preservative free 04/17/2022    Pneumococcal conjugate vaccine, unspecified 05/24/2016    Pneumococcal polysaccharide (PNEUMOVAX 23), 23-valent 08/02/2016    Tdap (tetanus, diphtheria reduced, acellular pertussis) (ADACEL/BOOSTRIX), adsorbed 08/01/2021        Past Medical History:  Medical History[4]    Past Surgical History:  Past Surgical History[5]    Family History:  Family History[6]    Social  History:  Social History[7]     The following sections were reviewed this encounter by the provider:        ROS:  Review of Systems   Review of systems as noted in the HPI.    Vitals:  There were no vitals taken for this visit.     Objective:     Physical Exam:  Physical Exam   Constitutional: General Appearance: well developed. Level of Distress: no acute distress.    Lungs / Chest: Respiratory effort: unlabored, speaking in full sentences, and no audible wheezing.    Mental Status Exam: Orientation alert and oriented. Affect: appropriate affect. Mood: appropriate mood. Language and Speech: normal speech and comprehension.     Assessment:     There are no diagnoses linked to this encounter.    Plan:     Patient to carvedilol   12.5 mg twice daily.  Continue with olmesartan  40 mg daily and amlodipine.  Continue to monitor blood pressures.    Red flag symptoms reviewed  Continue to follow with cardiologist indicated  Follow up if needed    Pt to increase / continue/ begin **** mg weekly   Discussed monitoring sugar levels closely***  Discussed medications, admin, side effects, care, and red flag sxs  Pt will take as prescribed  Care and red flag sxs reviewed  Discussed side effects, particularly nausea d/t delayed gastric emptying, possibility of pancreatitis. Denies hx pancreatitis, personal or family hx of medullary thyroid  cancer or MEN2.  Patient to engage in regular exercise and healthy diet  Patient follow-up in 2-4 weeks sooner if needed    Burnard JONETTA Nap, FNP         [1]   Patient Active Problem List  Diagnosis    Type 2 diabetes mellitus without complication (CMS/HCC)    Obstructive sleep apnea syndrome    Morbid obesity (CMS/HCC)    Hypertensive disorder    Encounter for weight loss counseling    Edema of lower extremity    Diabetes mellitus (CMS/HCC)    Congestive heart failure (CMS/HCC)    Arthritis of knee    Rheumatoid arthritis, involving unspecified site, unspecified whether rheumatoid factor  present (CMS/HCC)    Snoring    History of pulmonary embolism    Type 2 diabetes mellitus with stage 3a chronic kidney disease, without long-term current use of insulin (CMS/HCC)    History of colon polyps    Osteopenia, unspecified location   [2]   Current Outpatient Medications   Medication Sig Dispense Refill    ACETAMINOPHEN PO Take 650 mg by mouth every 6 (six) hours as needed      albuterol sulfate HFA (PROVENTIL) 108 (90 Base) MCG/ACT inhaler Inhale 2 puffs into the lungs every 4 (four) hours as needed for Wheezing      amLODIPine (NORVASC) 10 MG tablet 1 tablet (10 mg)      carvedilol  (COREG ) 12.5 MG tablet Take 1 tablet (  12.5 mg) by mouth 2 (two) times daily 180 tablet 1    diclofenac  Sodium (VOLTAREN ) 1 % Gel topical gel Apply 2 g topically 4 (four) times daily 50 g 0    Eliquis  5 MG Take 1 tablet (5 mg) by mouth every 12 (twelve) hours 180 tablet 1    furosemide  (LASIX ) 20 MG tablet Take 1 tablet (20 mg) by mouth every morning 90 tablet 2    latanoprost (XALATAN) 0.005 % ophthalmic solution Place 1 drop into both eyes nightly      Loratadine (CLARITIN PO) Take 10 mg by mouth every morning      montelukast (SINGULAIR) 10 MG tablet Take 1 tablet (10 mg) by mouth nightly      Multiple Vitamins-Minerals (multivitamin with minerals) tablet Take 1 tablet by mouth every morning      olmesartan  (BENICAR ) 40 MG tablet Take 1 tablet (40 mg) by mouth every morning 90 tablet 2    semaglutide  (Ozempic , 0.25 or 0.5 MG/DOSE,) 2 MG/3ML Inject 0.25 mg into the skin once a week 1.5 mL 0    timolol (TIMOPTIC) 0.5 % ophthalmic solution Place 1 drop into both eyes every morning      VITAMIN D  PO Take 10,000 Units by mouth every morning       No current facility-administered medications for this visit.   [3]   Allergies  Allergen Reactions    Meloxicam Anaphylaxis   [4]   Past Medical History:  Diagnosis Date    Arthritis     Asthma     Bilateral cataracts     Colon polyps 2012    Congestive heart failure (CMS/HCC)      controlled with meds    COVID-19 vaccine administered     Gastroesophageal reflux disease     Glaucoma     History of Chiari malformation 03/19/2019    [facility] ER, dx after fall    Hypertension     controlled on meds    Morbid obesity with BMI of 40.0-44.9, adult (CMS/HCC)     Prediabetes     last A1c 5.0 04/17/22    Pulmonary embolism (CMS/HCC) 2016    on Eliquis     Sleep apnea     OSA with CPAP   [5]   Past Surgical History:  Procedure Laterality Date    CATARACT EXTRACTION Bilateral 08/17/2022    glacoma removal as well    COLONOSCOPY, DIAGNOSTIC (SCREENING) N/A 09/01/2022    Procedure: COLONOSCOPY;  Surgeon: Jama Claretta DEL, MD;  Location: [facility];  Service: Gastroenterology;  Laterality: N/A;    dental implants  06/16/2022    KNEE SURGERY Right 2012    TOOTH EXTRACTION  05/16/2022    upper   [6]   Family History  Problem Relation Name Age of Onset    Cancer Mother      Diabetes Mother      Arthritis Mother      Obesity Mother      Cancer Maternal Grandfather     [7]   Social History  Socioeconomic History    Marital status: Married     Spouse name: wilbur    Number of children: 3   Tobacco Use    Smoking status: Never    Smokeless tobacco: Never   Vaping Use    Vaping status: Never Used   Substance and Sexual Activity    Alcohol use: Not Currently    Drug use: Never   Other Topics Concern  Dietary supplements / vitamins No    Anesthesia problems No    Blood thinners No    Eats large amounts No    Excessive Sweets No    Skips meals No    Eats excessive starches No    Snacks or grazes Yes    Emotional eater Yes    Eats fried food No    Eats fast food No    Diet Center No    [employer] No    [facility] Weight Loss No    Nutri-System No    Opti-Fast / Medi-Fast No    Overeaters Anonymous No    [facility] Yes    TOPS No    Weight Watchers No    Atkins No    Binging / Purging No    Calorie Counting No    Fasting No    High Protein Yes    Low Carb Yes    Low Fat Yes    [facility] Diet No     Slim Fast No    [place] No    Stationary cycle or treadmill No    Gym/fitness Classes No    Home exercise/video No    Swimming No    Weight training No    Walking or running Yes    Hospitalization No    Hypnosis No    Physical therapy No    Psychological therapy No    Residential program No    Acutrim No    [place] No    Contrave No    Dexatrim No    Diethylpropion No    Fastin No    Fen - Phen No    Ionamin / Adipex No    Phentermine No    Qsymia No    Prozac No    Saxenda No    Topamax No    Wellbutrin No    Xenical (Orlistat, Alli) No    Other Med No    No impairment No    Walks with cane/crutch No    Requires a wheelchair No    Bedridden No    Are you currently being treated for depression? No    Do you snore? No    Are you receiving any medical or psychological services? No    Do you ever wake up at night gasping for breath? No    Do you have or have you been treated for an eating disorder? No    Anyone ever told you that you stop breathing while asleep? No    Do you exercise regularly? Yes    Have you or family member ever have trouble with anesthesia? 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     Social Drivers of Health     Financial Resource Strain: Low Risk  (09/07/2022)    Overall Financial Resource Strain (CARDIA)     Difficulty of Paying Living Expenses: Not very hard   Food Insecurity: No Food Insecurity (09/07/2022)    Hunger Vital Sign     Worried About Running Out of Food in the Last Year: Never true     Ran Out of Food in the Last Year: Never true   Transportation Needs: No Transportation Needs (09/07/2022)    PRAPARE - Therapist, art (Medical): No     Lack of Transportation (Non-Medical): No   Physical Activity: Insufficiently  Active (09/07/2022)    Exercise Vital Sign     Days of Exercise per Week: 3 days     Minutes of Exercise per Session: 40 min   Stress: No Stress Concern Present (09/07/2022)    [employer] of  Occupational Health - Occupational Stress Questionnaire     Feeling of Stress : Not at all   Social Connections: Socially Integrated (09/07/2022)    Social Connection and Isolation Panel     Frequency of Communication with Friends and Family: More than three times a week     Frequency of Social Gatherings with Friends and Family: More than three times a week     Attends Religious Services: More than 4 times per year     Active Member of [employer] or Organizations: Yes     Attends Banker Meetings: More than 4 times per year     Marital Status: Married   Catering manager Violence: Not At Risk (09/07/2022)    Humiliation, Afraid, Rape, and Kick questionnaire     Fear of Current or Ex-Partner: No     Emotionally Abused: No     Physically Abused: No     Sexually Abused: No    Received from [facility]    Housing Stability

## 2024-03-20 ENCOUNTER — Telehealth (INDEPENDENT_AMBULATORY_CARE_PROVIDER_SITE_OTHER): Admitting: Family Medicine

## 2024-03-20 ENCOUNTER — Encounter (INDEPENDENT_AMBULATORY_CARE_PROVIDER_SITE_OTHER): Payer: Self-pay | Admitting: Family Medicine

## 2024-03-20 VITALS — Wt 283.0 lb

## 2024-03-20 DIAGNOSIS — E119 Type 2 diabetes mellitus without complications: Secondary | ICD-10-CM

## 2024-03-20 DIAGNOSIS — I509 Heart failure, unspecified: Secondary | ICD-10-CM

## 2024-03-20 DIAGNOSIS — G4733 Obstructive sleep apnea (adult) (pediatric): Secondary | ICD-10-CM

## 2024-03-20 DIAGNOSIS — I10 Essential (primary) hypertension: Secondary | ICD-10-CM

## 2024-03-20 MED ORDER — CARVEDILOL 25 MG PO TABS
25.0000 mg | ORAL_TABLET | Freq: Two times a day (BID) | ORAL | Status: AC
Start: 2024-03-20 — End: ?

## 2024-03-23 ENCOUNTER — Other Ambulatory Visit (INDEPENDENT_AMBULATORY_CARE_PROVIDER_SITE_OTHER): Payer: Self-pay | Admitting: Family Medicine

## 2024-03-23 DIAGNOSIS — Z86711 Personal history of pulmonary embolism: Secondary | ICD-10-CM

## 2024-03-26 NOTE — Telephone Encounter (Signed)
 Rx refill(s) sent.

## 2024-04-09 NOTE — Progress Notes (Unsigned)
 [facility]                       Date of Exam: 04/10/2024 11:37 AM        Patient ID: Caitlin Malone is a 68 y.o. female.  Attending Physician: Burnard JONETTA Nap, FNP         Patient is seen by a virtual visit today, with real time audio and video call. Patient gives verbal consent to continue with the telemedicine visit. Patient lives in [place]  and is currently at home. Identity was verified by an existing patient relationship. I educated patient on the virtual visit platform and shared my credentials. I informed the patient that if the virtual visit was interrupted, I would call back on their home/cell phone.    Please pardon any potential grammatical errors or typos as aspects of this note may be have been created through speech-to-text software. There may be inherent errors or limitations not intended by the user. Not all errors are caught or corrected. If there are questions or concerns about the content of this note or the information contained within the body of this dictation they should be addressed directly with the author for clarification.    Subjective:   Chief Complaint:  No chief complaint on file.      HPI:  HPI     Patient is a 68 y.o. female who presents today for a vv for medication follow up.     Patient currently taking semaglutide  0.25 mg  weekly   Pt has been taking medication for 4 weeks  Denies any significant side effects from medication  Patient currently has lost about****pounds  Patient has been continuing to work on following a healthy diet and regular exercise  Denies hx pancreatitis, personal or family hx of medullary thyroid  cancer or MEN2.    Patient also had recently been seen for concerns of elevated blood pressure.  Her carvedilol  had been increased from 12.5 mg daily to twice daily.  At her last virtual visit it was increased to 25 mg twice daily.  She also continues with olmesartan  and amlodipine daily for her blood pressure.  She has  been monitoring her blood pressure at home which she states has been averaging *****    She does follow-up with a cardiologist regularly for management of her cardiac medical conditions.    No chest pain dizziness palpitations SOB   Problem List:  Problem List[1]    Current Medications:  Current Medications[2]    Allergies:  Allergies[3]    Immunizations:  Immunization History   Administered Date(s) Administered    COVID-19 mRNA BIVALENT vaccine 12 years and above [employer]) 30 mcg/0.3 mL 06/12/2021    COVID-19 mRNA MONOVALENT vaccine PRIMARY SERIES 12 years and above (Moderna) 100 mcg/0.5 mL 10/11/2019, 11/02/2019    COVID-19 mRNA MONOVALENT vaccine PRIMARY SERIES 12 years and above [employer]) 30 mcg/0.3 mL (DILUTE BEFORE USE) 10/11/2019, 10/23/2019, 04/01/2020, 05/04/2020    COVID-19 mRNA MONOVALENT vaccine PRIMARY SERIES 12 years and above [employer]) 30 mcg/0.3 mL (DO NOT DILUTE) 11/21/2020    INFLUENZA TRIVALENT PF SD 6 MO AND OLDER (FLUARIX/FLULAVAL) 04/02/2023    Influenza (Flu) vaccine 04/27/2015, 05/25/2015, 06/01/2016    Influenza quadrivalent (AFLURIA/FLUARIX/FLULAVAL/FLUZONE), 6 months and older, 0.5 mL, preservative free 06/12/2021    Influenza quadrivalent (AFLURIA/FLUZONE), 6 months and older, multi-dose, 5 mL 04/23/2018, 05/24/2018    Influenza quadrivalent high-dose (FLUZONE HIGH-DOSE) 65 years and older, 0.7 mL, preservative  free 04/17/2022    Influenza trivalent high-dose (FLUZONE HIGH-DOSE), 65 years and older, preservative free 07/24/2012    Influenza vaccine whole virus 06/10/2007    Pneumococcal conjugate (PREVNAR 20) 20-valent, preservative free 04/17/2022    Pneumococcal conjugate vaccine, unspecified 05/24/2016    Pneumococcal polysaccharide (PNEUMOVAX 23), 23-valent 08/02/2016    Tdap (tetanus, diphtheria reduced, acellular pertussis) (ADACEL/BOOSTRIX), adsorbed 08/01/2021        Past Medical History:  Medical History[4]    Past Surgical History:  Past Surgical History[5]    Family  History:  Family History[6]    Social History:  Social History[7]     The following sections were reviewed this encounter by the provider:        ROS:  Review of Systems   Review of systems as noted in the HPI.    Vitals:  There were no vitals taken for this visit.     Objective:     Physical Exam:  Physical Exam   Constitutional: General Appearance: well developed. Level of Distress: no acute distress.    Lungs / Chest: Respiratory effort: unlabored, speaking in full sentences, and no audible wheezing.    Mental Status Exam: Orientation alert and oriented. Affect: appropriate affect. Mood: appropriate mood. Language and Speech: normal speech and comprehension.     Assessment:     There are no diagnoses linked to this encounter.    Plan:     Pt to increase / continue/ begin **** mg weekly   Discussed monitoring sugar levels closely***  Discussed medications, admin, side effects, care, and red flag sxs  Pt will take as prescribed  Care and red flag sxs reviewed  Discussed side effects, particularly nausea d/t delayed gastric emptying, possibility of pancreatitis. Denies hx pancreatitis, personal or family hx of medullary thyroid  cancer or MEN2.  Patient to engage in regular exercise and healthy diet  Patient follow-up in 2-4 weeks sooner if needed    Burnard JONETTA Nap, FNP         [1]   Patient Active Problem List  Diagnosis    Type 2 diabetes mellitus without complication (CMS/HCC)    Obstructive sleep apnea syndrome    Morbid obesity (CMS/HCC)    Hypertensive disorder    Encounter for weight loss counseling    Edema of lower extremity    Diabetes mellitus (CMS/HCC)    Congestive heart failure (CMS/HCC)    Arthritis of knee    Rheumatoid arthritis, involving unspecified site, unspecified whether rheumatoid factor present (CMS/HCC)    Snoring    History of pulmonary embolism    Type 2 diabetes mellitus with stage 3a chronic kidney disease, without long-term current use of insulin (CMS/HCC)    History of colon polyps     Osteopenia, unspecified location   [2]   Current Outpatient Medications   Medication Sig Dispense Refill    ACETAMINOPHEN PO Take 650 mg by mouth every 6 (six) hours as needed      albuterol sulfate HFA (PROVENTIL) 108 (90 Base) MCG/ACT inhaler Inhale 2 puffs into the lungs every 4 (four) hours as needed for Wheezing      amLODIPine (NORVASC) 10 MG tablet 1 tablet (10 mg)      carvedilol  (COREG ) 25 MG tablet Take 1 tablet (25 mg) by mouth 2 (two) times daily      diclofenac  Sodium (VOLTAREN ) 1 % Gel topical gel Apply 2 g topically 4 (four) times daily 50 g 0    Eliquis  5 MG tablet  TAKE 1 TABLET(5 MG) BY MOUTH EVERY 12 HOURS 180 tablet 0    furosemide  (LASIX ) 20 MG tablet Take 1 tablet (20 mg) by mouth every morning 90 tablet 2    latanoprost (XALATAN) 0.005 % ophthalmic solution Place 1 drop into both eyes nightly      Loratadine (CLARITIN PO) Take 10 mg by mouth every morning      montelukast (SINGULAIR) 10 MG tablet Take 1 tablet (10 mg) by mouth nightly      Multiple Vitamins-Minerals (multivitamin with minerals) tablet Take 1 tablet by mouth every morning      olmesartan  (BENICAR ) 40 MG tablet Take 1 tablet (40 mg) by mouth every morning 90 tablet 2    semaglutide  (Ozempic , 0.25 or 0.5 MG/DOSE,) 2 MG/3ML Inject 0.25 mg into the skin once a week 1.5 mL 0    timolol (TIMOPTIC) 0.5 % ophthalmic solution Place 1 drop into both eyes every morning      VITAMIN D  PO Take 10,000 Units by mouth every morning       No current facility-administered medications for this visit.   [3]   Allergies  Allergen Reactions    Meloxicam Anaphylaxis   [4]   Past Medical History:  Diagnosis Date    Arthritis     Asthma     Bilateral cataracts     Colon polyps 2012    Congestive heart failure (CMS/HCC)     controlled with meds    COVID-19 vaccine administered     Gastroesophageal reflux disease     Glaucoma     History of Chiari malformation 03/19/2019    [facility] ER, dx after fall    Hypertension     controlled on meds     Morbid obesity with BMI of 40.0-44.9, adult (CMS/HCC)     Prediabetes     last A1c 5.0 04/17/22    Pulmonary embolism (CMS/HCC) 2016    on Eliquis     Sleep apnea     OSA with CPAP   [5]   Past Surgical History:  Procedure Laterality Date    CATARACT EXTRACTION Bilateral 08/17/2022    glacoma removal as well    COLONOSCOPY, DIAGNOSTIC (SCREENING) N/A 09/01/2022    Procedure: COLONOSCOPY;  Surgeon: Jama Claretta DEL, MD;  Location: [facility];  Service: Gastroenterology;  Laterality: N/A;    dental implants  06/16/2022    KNEE SURGERY Right 2012    TOOTH EXTRACTION  05/16/2022    upper   [6]   Family History  Problem Relation Name Age of Onset    Cancer Mother      Diabetes Mother      Arthritis Mother      Obesity Mother      Cancer Maternal Grandfather     [7]   Social History  Socioeconomic History    Marital status: Married     Spouse name: wilbur    Number of children: 3   Tobacco Use    Smoking status: Never    Smokeless tobacco: Never   Vaping Use    Vaping status: Never Used   Substance and Sexual Activity    Alcohol use: Not Currently    Drug use: Never   Other Topics Concern    Dietary supplements / vitamins No    Anesthesia problems No    Blood thinners No    Eats large amounts No    Excessive Sweets No    Skips meals No  Eats excessive starches No    Snacks or grazes Yes    Emotional eater Yes    Eats fried food No    Eats fast food No    Diet Center No    [employer] No    [facility] Weight Loss No    Nutri-System No    Opti-Fast / Medi-Fast No    Overeaters Anonymous No    [facility] Yes    TOPS No    Weight Watchers No    Atkins No    Binging / Purging No    Calorie Counting No    Fasting No    High Protein Yes    Low Carb Yes    Low Fat Yes    [facility] Diet No    Slim Fast No    [place] No    Stationary cycle or treadmill No    Gym/fitness Classes No    Home exercise/video No    Swimming No    Weight training No    Walking or running Yes    Hospitalization No    Hypnosis No    Physical  therapy No    Psychological therapy No    Residential program No    Acutrim No    [place]etta No    Contrave No    Dexatrim No    Diethylpropion No    Fastin No    Fen - Phen No    Ionamin / Adipex No    Phentermine No    Qsymia No    Prozac No    Saxenda No    Topamax No    Wellbutrin No    Xenical (Orlistat, Alli) No    Other Med No    No impairment No    Walks with cane/crutch No    Requires a wheelchair No    Bedridden No    Are you currently being treated for depression? No    Do you snore? No    Are you receiving any medical or psychological services? No    Do you ever wake up at night gasping for breath? No    Do you have or have you been treated for an eating disorder? No    Anyone ever told you that you stop breathing while asleep? No    Do you exercise regularly? Yes    Have you or family member ever have trouble with anesthesia? 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     Social Drivers of Health     Financial Resource Strain: Low Risk  (03/19/2024)    Overall Financial Resource Strain (CARDIA)     Difficulty of Paying Living Expenses: Not hard at all   Food Insecurity: No Food Insecurity (03/19/2024)    Hunger Vital Sign     Worried About Running Out of Food in the Last Year: Never true     Ran Out of Food in the Last Year: Never true   Transportation Needs: No Transportation Needs (03/19/2024)    PRAPARE - Therapist, art (Medical): No     Lack of Transportation (Non-Medical): No   Physical Activity: Insufficiently Active (03/19/2024)    Exercise Vital Sign     Days of Exercise per Week: 2 days     Minutes of Exercise per Session: 30 min   Stress: No Stress Concern Present (03/19/2024)  [employer] of Occupational Health - Occupational Stress Questionnaire     Feeling of Stress : Only a little   Social Connections: Socially Integrated (09/07/2022)    Social Connection and Isolation Panel     Frequency of Communication with  Friends and Family: More than three times a week     Frequency of Social Gatherings with Friends and Family: More than three times a week     Attends Religious Services: More than 4 times per year     Active Member of [employer] or Organizations: Yes     Attends Banker Meetings: More than 4 times per year     Marital Status: Married   Catering manager Violence: Not At Risk (03/19/2024)    Humiliation, Afraid, Rape, and Kick questionnaire     Fear of Current or Ex-Partner: No     Emotionally Abused: No     Physically Abused: No     Sexually Abused: No   Housing Stability: Not At Risk (03/19/2024)    Housing Stability NCSS     Do you have housing?: Yes     Are you worried about losing your housing?: No

## 2024-04-10 ENCOUNTER — Encounter (INDEPENDENT_AMBULATORY_CARE_PROVIDER_SITE_OTHER): Payer: Self-pay | Admitting: Family Medicine

## 2024-04-10 ENCOUNTER — Telehealth (INDEPENDENT_AMBULATORY_CARE_PROVIDER_SITE_OTHER): Admitting: Family Medicine

## 2024-04-10 DIAGNOSIS — I10 Essential (primary) hypertension: Secondary | ICD-10-CM

## 2024-04-10 DIAGNOSIS — E1122 Type 2 diabetes mellitus with diabetic chronic kidney disease: Secondary | ICD-10-CM

## 2024-04-10 DIAGNOSIS — I509 Heart failure, unspecified: Secondary | ICD-10-CM

## 2024-04-10 DIAGNOSIS — G4733 Obstructive sleep apnea (adult) (pediatric): Secondary | ICD-10-CM

## 2024-04-10 DIAGNOSIS — N1831 Chronic kidney disease, stage 3a: Secondary | ICD-10-CM

## 2024-04-10 DIAGNOSIS — E119 Type 2 diabetes mellitus without complications: Secondary | ICD-10-CM

## 2024-04-10 MED ORDER — SEMAGLUTIDE(0.25 OR 0.5MG/DOS) 2 MG/3ML SC SOPN: 0.5000 mg | PEN_INJECTOR | SUBCUTANEOUS | 0 refills | Status: DC

## 2024-04-28 ENCOUNTER — Encounter (INDEPENDENT_AMBULATORY_CARE_PROVIDER_SITE_OTHER): Payer: Self-pay | Admitting: Family Medicine

## 2024-05-07 ENCOUNTER — Ambulatory Visit (INDEPENDENT_AMBULATORY_CARE_PROVIDER_SITE_OTHER): Admitting: Family Medicine

## 2024-05-08 ENCOUNTER — Telehealth (INDEPENDENT_AMBULATORY_CARE_PROVIDER_SITE_OTHER): Admitting: Family Medicine

## 2024-05-13 ENCOUNTER — Other Ambulatory Visit (INDEPENDENT_AMBULATORY_CARE_PROVIDER_SITE_OTHER): Payer: Self-pay | Admitting: Family Medicine

## 2024-06-10 ENCOUNTER — Other Ambulatory Visit (INDEPENDENT_AMBULATORY_CARE_PROVIDER_SITE_OTHER): Payer: Self-pay | Admitting: Family Medicine

## 2024-06-10 DIAGNOSIS — E1122 Type 2 diabetes mellitus with diabetic chronic kidney disease: Secondary | ICD-10-CM

## 2024-06-23 ENCOUNTER — Encounter (INDEPENDENT_AMBULATORY_CARE_PROVIDER_SITE_OTHER): Payer: Self-pay | Admitting: Family Medicine

## 2024-07-12 ENCOUNTER — Encounter (INDEPENDENT_AMBULATORY_CARE_PROVIDER_SITE_OTHER): Payer: Self-pay | Admitting: Family Medicine

## 2024-07-14 ENCOUNTER — Encounter (INDEPENDENT_AMBULATORY_CARE_PROVIDER_SITE_OTHER): Payer: Self-pay | Admitting: Family Medicine

## 2024-07-18 ENCOUNTER — Other Ambulatory Visit (INDEPENDENT_AMBULATORY_CARE_PROVIDER_SITE_OTHER): Payer: Self-pay

## 2024-07-18 ENCOUNTER — Encounter (INDEPENDENT_AMBULATORY_CARE_PROVIDER_SITE_OTHER): Payer: Self-pay

## 2024-07-18 ENCOUNTER — Ambulatory Visit (INDEPENDENT_AMBULATORY_CARE_PROVIDER_SITE_OTHER)

## 2024-07-18 VITALS — BP 114/71 | HR 68 | Temp 97.4°F | Resp 18 | Wt 282.8 lb

## 2024-07-18 DIAGNOSIS — I10 Essential (primary) hypertension: Secondary | ICD-10-CM

## 2024-07-18 DIAGNOSIS — I160 Hypertensive urgency: Secondary | ICD-10-CM

## 2024-07-18 MED ORDER — AMLODIPINE BESYLATE 5 MG PO TABS: 5.0000 mg | ORAL_TABLET | Freq: Every day | ORAL | 2 refills | Status: AC

## 2024-07-18 MED ORDER — FOLIC ACID 1 MG PO TABS: 1.0000 mg | ORAL_TABLET | Freq: Every day | ORAL | 0 refills | Status: AC

## 2024-07-18 NOTE — Progress Notes (Addendum)
 [name]   OFFICE VISIT                HPI     Chief Complaint   Patient presents with    Follow-up     Hospital f/u, HTN so was put on new meds that pt states have been working, however when BP is lower than 120 she gets headaches and pains in upper body      HPI    Caitlin Malone is a 68 y.o. female who presents to office for follow up from hospital/ICU where she was diagnosed with hypertensive urgency.    She reports coming home from flight from [place] . She reported having 2-3 minutes of pain across the chest. She went to bed that night and the following mornign her Bps were in 220/110s. She went to emergency room where they confirmed these high readings. She confirms that she was adherent to her medications. She was admitted to ICU where she was put on nifedipine. Her meds were then adjusted from olmesartan  to losartan. Amlodipine  was also added. Her BP was controlled and she was told to follow up with PCP. They did echo, which was within normal limits. CTA did not reveal any acute cardiopulmonary pathologies. EKG did not reveal STEMI.  BMP and CBC were within normal limits.    BPs typically 160-170s/60-80s at home prior to the event. She typically does not tolerate very low blood pressures.    Meds include:  Losartan 50 mg once daily (new)  Amlodipine  10 mg once daily (new)  Coreg  25 mg twice daily  Lasix   20 mg once in the morning daily    Dr. Jama [facility] is cardiologist. She has an appointment in January to follow up. Being seen for HTN and HFpEF (improved)      ----     LIFESTYLE    Wt Readings from Last 3 Encounters:   07/18/24 128.3 kg (282 lb 12.8 oz)   04/10/24 126.1 kg (278 lb)   03/20/24 128.4 kg (283 lb)        NUTRITION:    24 Hour food Recall:    Pescaterian   Breakfast: Vege -link and patties, OJ, and Toast (berries with yoghurt)  Lunch: Salad, rice and beans with veggies, lentil soup, or steamed cabbages with lemon-ginger water with fruits  Dinner: Salmon with rice and  beans lemon-ginger water  Snacks: fruits  Sweetened beverages: None    Alcohol: None  Smoking: None  Drugs; None    PHYSICAL ACTIVITY:    Cardiovascular Exercise: Unable to do walking 2/2 arthritis.    SLEEP:    Patient endorses 7 hours of sleep  CPAP - uses sleep apnea machine      ROS   Review of Systems   All other systems reviewed and are negative.      Vital Signs   BP 114/71 (BP Site: Left arm, Patient Position: Sitting)   Pulse 68   Temp 97.4 F (36.3 C) (Tympanic)   Resp 18   Wt 128.3 kg (282 lb 12.8 oz)   SpO2 96%   BMI 46.34 kg/m   Physical Exam   Physical Exam  Vitals and nursing note reviewed.   Constitutional:       General: She is not in acute distress.     Appearance: Normal appearance. She is not ill-appearing.   HENT:      Head: Normocephalic.   Cardiovascular:      Rate and Rhythm: Normal rate  and regular rhythm.      Pulses: Normal pulses.      Heart sounds: Normal heart sounds. No murmur heard.     No friction rub. No gallop.   Pulmonary:      Effort: Pulmonary effort is normal. No respiratory distress.      Breath sounds: Normal breath sounds. No stridor. No wheezing, rhonchi or rales.   Chest:      Chest wall: No tenderness.   Skin:     Capillary Refill: Capillary refill takes less than 2 seconds.   Neurological:      Mental Status: She is alert.        Assessment/Plan     Assessment & Plan  Hypertensive urgency  Hypertension, unspecified type  Patient's BP 114/71. This is best it's ever been. She typically has headaches and arm pain when blood pressure gets too low, but currently she doesn't have any pain. I talked about reducing amlodipine  5 mg once daily for now. She can check her blood pressures regularly. If within good range, we can continue with 5 mg. If BPs start to creep up, we can titrate up back to 10 mg. She should follow up in 1 month to recheck. She will bring log of BPs  with her as well. Talked about veggie links and patties. They have a lot of sodium, which could be  impacting her readings. I recommended her to check the sodium. Daily sodium should be less than 2000 mg /day. Patient's BP goal should be less than 140/90. ACC/AHA may have stricter guidelines.  Orders:    amLODIPine  (NORVASC ) 5 MG tablet; Take 1 tablet (5 mg) by mouth once daily    Zemar Ahadzada, DO

## 2024-07-18 NOTE — Patient Instructions (Signed)
TENS unit for pain °

## 2024-07-18 NOTE — Assessment & Plan Note (Addendum)
 Patient's BP 114/71. This is best it's ever been. She typically has headaches and arm pain when blood pressure gets too low, but currently she doesn't have any pain. I talked about reducing amlodipine  5 mg once daily for now. She can check her blood pressures regularly. If within good range, we can continue with 5 mg. If BPs start to creep up, we can titrate up back to 10 mg. She should follow up in 1 month to recheck. She will bring log of BPs  with her as well. Talked about veggie links and patties. They have a lot of sodium, which could be impacting her readings. I recommended her to check the sodium. Daily sodium should be less than 2000 mg /day. Patient's BP goal should be less than 140/90. ACC/AHA may have stricter guidelines.  Orders:    amLODIPine  (NORVASC ) 5 MG tablet; Take 1 tablet (5 mg) by mouth once daily

## 2024-08-06 ENCOUNTER — Telehealth (INDEPENDENT_AMBULATORY_CARE_PROVIDER_SITE_OTHER): Payer: Self-pay

## 2024-08-06 DIAGNOSIS — I10 Essential (primary) hypertension: Secondary | ICD-10-CM

## 2024-08-06 DIAGNOSIS — I509 Heart failure, unspecified: Secondary | ICD-10-CM

## 2024-08-06 NOTE — Telephone Encounter (Signed)
 Pt calls  back to report that she takes:   Losartan 50 mg 2 tabs daily  ( confirmed as well on [facility] d/c note from Cardiology)     Will ask Dr Arcola to send rx to her [facility] as on chart

## 2024-08-06 NOTE — Telephone Encounter (Addendum)
 lmtcb on cell # vc mail  Need to verify Losartan dose    Will then need to ask Dr Arcola to send rx in for pt as this will be a new Rx

## 2024-08-06 NOTE — Telephone Encounter (Signed)
 T/c from pt will need rf on Losartan   She was placed on this during ER/Hospital visit in December  Has appt w/ Cardio Dr Jama tomorrow but ran out of med today  Will send rf into local [facility] for her

## 2024-08-07 ENCOUNTER — Encounter (INDEPENDENT_AMBULATORY_CARE_PROVIDER_SITE_OTHER): Payer: Self-pay | Admitting: Family Medicine

## 2024-08-12 MED ORDER — LOSARTAN POTASSIUM 50 MG PO TABS: 100.0000 mg | ORAL_TABLET | Freq: Every day | ORAL | 0 refills | Status: AC

## 2024-08-12 NOTE — Telephone Encounter (Signed)
 Losartan  50 mg BID sent to pharmacy.    Zemar Ahadzada, DO

## 2024-08-17 ENCOUNTER — Other Ambulatory Visit (INDEPENDENT_AMBULATORY_CARE_PROVIDER_SITE_OTHER): Payer: Self-pay

## 2024-08-17 DIAGNOSIS — I10 Essential (primary) hypertension: Secondary | ICD-10-CM

## 2024-08-17 DIAGNOSIS — I509 Heart failure, unspecified: Secondary | ICD-10-CM
